# Patient Record
Sex: Male | Born: 1980 | Marital: Single | Smoking: Never smoker
Health system: Southern US, Community
[De-identification: ages and names within clinical notes are randomized; demographics above are authoritative.]

## PROBLEM LIST (undated history)

## (undated) DIAGNOSIS — E103299 Type 1 diabetes mellitus with mild nonproliferative diabetic retinopathy without macular edema, unspecified eye: Secondary | ICD-10-CM

## (undated) DIAGNOSIS — I10 Essential (primary) hypertension: Secondary | ICD-10-CM

## (undated) DIAGNOSIS — E109 Type 1 diabetes mellitus without complications: Secondary | ICD-10-CM

## (undated) DIAGNOSIS — E785 Hyperlipidemia, unspecified: Secondary | ICD-10-CM

## (undated) DIAGNOSIS — N183 Chronic kidney disease, stage 3 (moderate): Secondary | ICD-10-CM

## (undated) HISTORY — DX: Hyperlipidemia, unspecified: E78.5

## (undated) HISTORY — DX: Type 1 diabetes mellitus without complications: E10.9

---
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
## 2014-06-14 ENCOUNTER — Encounter: Payer: Self-pay | Admitting: Family Medicine

## 2014-06-14 ENCOUNTER — Ambulatory Visit (INDEPENDENT_AMBULATORY_CARE_PROVIDER_SITE_OTHER): Payer: Managed Care, Other (non HMO) | Admitting: Family Medicine

## 2014-06-14 VITALS — BP 156/100 | HR 86 | Temp 98.8°F | Resp 18 | Ht 64.5 in | Wt 151.8 lb

## 2014-06-14 DIAGNOSIS — E109 Type 1 diabetes mellitus without complications: Secondary | ICD-10-CM | POA: Diagnosis not present

## 2014-06-14 DIAGNOSIS — I10 Essential (primary) hypertension: Secondary | ICD-10-CM | POA: Diagnosis not present

## 2014-06-14 DIAGNOSIS — E785 Hyperlipidemia, unspecified: Secondary | ICD-10-CM

## 2014-06-14 DIAGNOSIS — Z23 Encounter for immunization: Secondary | ICD-10-CM | POA: Diagnosis not present

## 2014-06-14 LAB — POCT URINALYSIS DIPSTICK
BILIRUBIN UA: NEGATIVE
GLUCOSE UA: 500
KETONES UA: NEGATIVE
Leukocytes, UA: NEGATIVE
Nitrite, UA: NEGATIVE
Protein, UA: 100
SPEC GRAV UA: 1.015
Urobilinogen, UA: 0.2
pH, UA: 6.5

## 2014-06-14 LAB — CBC WITH DIFFERENTIAL/PLATELET
BASOS PCT: 1 % (ref 0–1)
Basophils Absolute: 0.1 10*3/uL (ref 0.0–0.1)
EOS ABS: 0.2 10*3/uL (ref 0.0–0.7)
Eosinophils Relative: 3 % (ref 0–5)
HEMATOCRIT: 41.2 % (ref 39.0–52.0)
Hemoglobin: 13.7 g/dL (ref 13.0–17.0)
Lymphocytes Relative: 28 % (ref 12–46)
Lymphs Abs: 2.2 10*3/uL (ref 0.7–4.0)
MCH: 28.5 pg (ref 26.0–34.0)
MCHC: 33.3 g/dL (ref 30.0–36.0)
MCV: 85.7 fL (ref 78.0–100.0)
MONOS PCT: 6 % (ref 3–12)
MPV: 9.9 fL (ref 8.6–12.4)
Monocytes Absolute: 0.5 10*3/uL (ref 0.1–1.0)
Neutro Abs: 4.8 10*3/uL (ref 1.7–7.7)
Neutrophils Relative %: 62 % (ref 43–77)
PLATELETS: 335 10*3/uL (ref 150–400)
RBC: 4.81 MIL/uL (ref 4.22–5.81)
RDW: 13 % (ref 11.5–15.5)
WBC: 7.7 10*3/uL (ref 4.0–10.5)

## 2014-06-14 LAB — COMPREHENSIVE METABOLIC PANEL
ALBUMIN: 4.2 g/dL (ref 3.5–5.2)
ALK PHOS: 56 U/L (ref 39–117)
ALT: 39 U/L (ref 0–53)
AST: 29 U/L (ref 0–37)
BILIRUBIN TOTAL: 0.4 mg/dL (ref 0.2–1.2)
BUN: 27 mg/dL — ABNORMAL HIGH (ref 6–23)
CO2: 29 mEq/L (ref 19–32)
Calcium: 9.5 mg/dL (ref 8.4–10.5)
Chloride: 100 mEq/L (ref 96–112)
Creat: 1.77 mg/dL — ABNORMAL HIGH (ref 0.50–1.35)
Glucose, Bld: 197 mg/dL — ABNORMAL HIGH (ref 70–99)
Potassium: 4.4 mEq/L (ref 3.5–5.3)
Sodium: 137 mEq/L (ref 135–145)
Total Protein: 7.2 g/dL (ref 6.0–8.3)

## 2014-06-14 LAB — LIPID PANEL
CHOL/HDL RATIO: 2.7 ratio
CHOLESTEROL: 161 mg/dL (ref 0–200)
HDL: 60 mg/dL (ref >=40)
LDL Cholesterol: 91 mg/dL (ref 0–99)
Triglycerides: 48 mg/dL (ref <150)
VLDL: 10 mg/dL (ref 0–40)

## 2014-06-14 LAB — TSH: TSH: 0.951 u[IU]/mL (ref 0.350–4.500)

## 2014-06-14 LAB — MICROALBUMIN, URINE: Microalb, Ur: 47.2 mg/dL — ABNORMAL HIGH (ref <2.0)

## 2014-06-14 LAB — POCT GLYCOSYLATED HEMOGLOBIN (HGB A1C): Hemoglobin A1C: 10

## 2014-06-14 MED ORDER — LISINOPRIL 10 MG PO TABS: 10.0000 mg | ORAL_TABLET | Freq: Every day | ORAL | Status: DC

## 2014-06-14 MED ORDER — SIMVASTATIN 5 MG PO TABS: 5.0000 mg | ORAL_TABLET | Freq: Every day | ORAL | Status: DC

## 2014-06-14 MED ORDER — INSULIN LISPRO 100 UNIT/ML CARTRIDGE: 5.0000 [IU] | Freq: Three times a day (TID) | SUBCUTANEOUS | Status: DC

## 2014-06-14 MED ORDER — HYDROCHLOROTHIAZIDE 12.5 MG PO TABS: 12.5000 mg | ORAL_TABLET | Freq: Every day | ORAL | Status: DC

## 2014-06-14 MED ORDER — AMLODIPINE BESYLATE 5 MG PO TABS: 5.0000 mg | ORAL_TABLET | Freq: Every day | ORAL | Status: DC

## 2014-06-14 NOTE — Progress Notes (Signed)
Subjective:    Patient ID: Jeffrey Hale, male    DOB: [birth year], 34 y.o.   MRN: [id number]  06/14/2014  Establish Care; Medication Refill; Hyperlipidemia; Hypertension; and Diabetes   HPI This 34 y.o. male presents to establish care.    Last physical: 06/01/2013 Colonoscopy:  never TDAP:  More than ten years ago. Pneumovax:  never Hepatitis B: UTD Influenza:   2015 Eye exam:  More than two years ago; due. Dental exam:  Last year; 2015   Gas [place] moving around with company[place] to [place]. Last office visit 06/2013 [place].    HTN: last blood pressure medication one week ago;.  BP usually runs 130/90s.  Diagnosed with HTN eight years ago.  Denies CP/palp/SOB/leg swelling.  Hyperlipidemia:  Started on medication 2013.  Last dose of Simvastatin one week ago.  Denies HA/dizziness/focal weakness/paresthesias.  DMI: diagnosed age 89; one admission in past for hypoglycemia.  Last HgbA1c of unsure.  Lantus 20 units qhs; Humalog 5 units with each meal; just ran out of Humalog yesterday; still has Lantus.  Checks sugars with each meal.  Ran out of strips.    Review of Systems  Constitutional: Negative for fever, chills, diaphoresis, activity change, appetite change and fatigue.  Eyes: Negative for visual disturbance.  Respiratory: Negative for cough and shortness of breath.   Cardiovascular: Negative for chest pain, palpitations and leg swelling.  Endocrine: Negative for cold intolerance, heat intolerance, polydipsia, polyphagia and polyuria.  Neurological: Negative for dizziness, tremors, seizures, syncope, facial asymmetry, speech difficulty, weakness, light-headedness, numbness and headaches.    Past Medical History  Diagnosis Date  . Diabetes mellitus without complication   . Diabetes mellitus type I     onset age 89  . Hypertension   . Hyperlipidemia    Past Surgical History  Procedure Laterality Date  . Lung surgery  1996    Right Lung      No Known Allergies Current Outpatient Prescriptions  Medication Sig Dispense Refill  . [DISCONTINUED] insulin glargine (LANTUS) 100 UNIT/ML injection Inject 20 Units into the skin daily.    Marland Kitchen. amLODipine (NORVASC) 5 MG tablet Take 1 tablet (5 mg total) by mouth daily. 90 tablet 1  . hydrochlorothiazide (HYDRODIURIL) 12.5 MG tablet Take 1 tablet (12.5 mg total) by mouth daily. 90 tablet 1  . insulin lispro (HUMALOG) 100 UNIT/ML cartridge Inject 0.05 mLs (5 Units total) into the skin 3 (three) times daily with meals. 15 mL 11  . lisinopril (PRINIVIL,ZESTRIL) 10 MG tablet Take 1 tablet (10 mg total) by mouth daily. 90 tablet 1  . simvastatin (ZOCOR) 5 MG tablet Take 1 tablet (5 mg total) by mouth daily. 90 tablet 1   No current facility-administered medications for this visit.   History   Social History  . Marital Status: Single    Spouse Name: N/A  . Number of Children: N/A  . Years of Education: N/A   Occupational History  . Not on file.   Social History Main Topics  . Smoking status: Never Smoker   . Smokeless tobacco: Not on file  . Alcohol Use: 0.0 oz/week    0 Standard drinks or equivalent per week     Comment: Beer - ocassionally   . Drug Use: No  . Sexual Activity: Yes   Other Topics Concern  . Not on file   Social History Narrative   Marital status: single; dating seriously x 5 years; [place]; moved to [place] in  08/2013.      Children: 2 children (44 yo daughter, 1 yo son)      Lives: alone; children with mothers in [place]      Employment:  Works for [employer]; Furniture conservator/restorer.      Tobacco: none      Alcohol: 4 drinks per week; no DUIs.      Drugs: none      Exercise:  Sporadic   Family History  Problem Relation Age of Onset  . Diabetes Mother     DMII  . Hypertension Mother   . Diabetes Father     DMII  . Hypertension Father   . Hyperlipidemia Father   . Depression Father         Objective:    BP 156/100 mmHg  Pulse 86   Temp(Src) 98.8 F (37.1 C) (Oral)  Resp 18  Ht 5' 4.5" (1.638 m)  Wt 151 lb 12.8 oz (68.856 kg)  BMI 25.66 kg/m2  SpO2 99% Physical Exam  Constitutional: He is oriented to person, place, and time. He appears well-developed and well-nourished. No distress.  HENT:  Head: Normocephalic and atraumatic.  Right Ear: External ear normal.  Left Ear: External ear normal.  Nose: Nose normal.  Mouth/Throat: Oropharynx is clear and moist.  Eyes: Conjunctivae and EOM are normal. Pupils are equal, round, and reactive to light.  Neck: Normal range of motion. Neck supple. Carotid bruit is not present. No thyromegaly present.  Cardiovascular: Normal rate, regular rhythm, normal heart sounds and intact distal pulses.  Exam reveals no gallop and no friction rub.   No murmur heard. Pulmonary/Chest: Effort normal and breath sounds normal. He has no wheezes. He has no rales.  Abdominal: Soft. Bowel sounds are normal. He exhibits no distension and no mass. There is no tenderness. There is no rebound and no guarding.  Lymphadenopathy:    He has no cervical adenopathy.  Neurological: He is alert and oriented to person, place, and time. No cranial nerve deficit.  Skin: Skin is warm and dry. No rash noted. He is not diaphoretic.  Psychiatric: He has a normal mood and affect. His behavior is normal.  Nursing note and vitals reviewed.  Results for orders placed or performed in visit on 06/14/14  POCT urinalysis dipstick  Result Value Ref Range   Color, UA yellow    Clarity, UA clear    Glucose, UA 500    Bilirubin, UA neg    Ketones, UA neg    Spec Grav, UA 1.015    Blood, UA trace    pH, UA 6.5    Protein, UA 100    Urobilinogen, UA 0.2    Nitrite, UA neg    Leukocytes, UA Negative   POCT glycosylated hemoglobin (Hb A1C)  Result Value Ref Range   Hemoglobin A1C 10.0    TDAP AND PNEUMOVAX ADMINISTERED.      Assessment & Plan:   1. Type 1 diabetes mellitus without complication   2. Essential  hypertension, benign   3. Hyperlipidemia   4. Need for prophylactic vaccination against Streptococcus pneumoniae (pneumococcus)   5. Need for Tdap vaccination     1. DMI: uncontrolled; refill of Humalog provided; refer to endocrinology for diabetes management; refer to ophthalmology for annual diabetic exam. 2.  HTN: uncontrolled due to non-compliance with medications; obtain u/a, labs.  Refill of medications provided. Asymptomatic.  RTC two months for follow-up. 3. Hyperlipidemia: uncontrolled due to non-compliance with medication.  4.  S/p TDAP  5.  S/p Pneumovax due to diabetes.   Meds ordered this encounter  Medications  . DISCONTD: amLODipine (NORVASC) 5 MG tablet    Sig: Take 5 mg by mouth daily.  Marland Kitchen DISCONTD: lisinopril (PRINIVIL,ZESTRIL) 10 MG tablet    Sig: Take 10 mg by mouth daily.  Marland Kitchen DISCONTD: hydrochlorothiazide (MICROZIDE) 12.5 MG capsule    Sig: Take 12.5 mg by mouth daily.  Marland Kitchen DISCONTD: simvastatin (ZOCOR) 5 MG tablet    Sig: Take 5 mg by mouth daily.  Marland Kitchen DISCONTD: insulin glargine (LANTUS) 100 UNIT/ML injection    Sig: Inject 20 Units into the skin daily.  Marland Kitchen DISCONTD: insulin lispro (HUMALOG) 100 UNIT/ML injection    Sig: Inject 5 Units into the skin 3 (three) times daily with meals.  Marland Kitchen amLODipine (NORVASC) 5 MG tablet    Sig: Take 1 tablet (5 mg total) by mouth daily.    Dispense:  90 tablet    Refill:  1  . hydrochlorothiazide (HYDRODIURIL) 12.5 MG tablet    Sig: Take 1 tablet (12.5 mg total) by mouth daily.    Dispense:  90 tablet    Refill:  1  . lisinopril (PRINIVIL,ZESTRIL) 10 MG tablet    Sig: Take 1 tablet (10 mg total) by mouth daily.    Dispense:  90 tablet    Refill:  1  . insulin lispro (HUMALOG) 100 UNIT/ML cartridge    Sig: Inject 0.05 mLs (5 Units total) into the skin 3 (three) times daily with meals.    Dispense:  15 mL    Refill:  11  . simvastatin (ZOCOR) 5 MG tablet    Sig: Take 1 tablet (5 mg total) by mouth daily.    Dispense:  90 tablet      Refill:  1    Return in about 2 months (around 08/14/2014) for recheck blood pressure.     Jeffrey Hale Paulita Fujita, M.D. [facility] [address] [place], [place]  [address] [phone] phone [phone] fax

## 2014-06-14 NOTE — Patient Instructions (Signed)

## 2014-06-30 ENCOUNTER — Encounter: Payer: Self-pay | Admitting: Endocrinology

## 2014-06-30 ENCOUNTER — Ambulatory Visit (INDEPENDENT_AMBULATORY_CARE_PROVIDER_SITE_OTHER): Payer: Managed Care, Other (non HMO) | Admitting: Endocrinology

## 2014-06-30 VITALS — BP 142/90 | HR 98 | Temp 98.3°F | Ht 64.5 in | Wt 155.0 lb

## 2014-06-30 DIAGNOSIS — E109 Type 1 diabetes mellitus without complications: Secondary | ICD-10-CM

## 2014-06-30 MED ORDER — GLUCAGON (RDNA) 1 MG IJ KIT: 1.0000 mg | PACK | Freq: Once | INTRAMUSCULAR | Status: AC | PRN

## 2014-06-30 NOTE — Patient Instructions (Addendum)
good diet and exercise significantly improve the control of your diabetes.  please let me know if you wish to be referred to a dietician.  high blood sugar is very risky to your health.  you should see an eye doctor and dentist every year.  It is very important to get all recommended vaccinations.  controlling your blood pressure and cholesterol drastically reduces the damage diabetes does to your body.  Those who smoke should quit.  please discuss these with your doctor.  check your blood sugar 4 times a day.  vary the time of day when you check, between before the 3 meals, and at bedtime.  also check if you have symptoms of your blood sugar being too high or too low.  please keep a record of the readings and bring it to your next appointment here.  You can write it on any piece of paper.  please call us sooner if your blood sugar goes below 70, or if you have a lot of readings over 200.  Please see Jeffrey Hale to day. To learn about pump therapy. For now: Please reduce the lantus to 16 units at bedtime, and Increase the humalog to 3-6 units 3 times a day (just before each meal). Please come back for a follow-up appointment in 2-4 weeks. i have sent a prescription to your pharmacy, for a medication called "glucagon."  This is a single-use emergency kit.  It will help you when your blood sugar is so low, you need someone else to help you.  The side-effect is nausea, so you should be turned on your side after getting this shot.

## 2014-06-30 NOTE — Progress Notes (Signed)
Subjective:    Patient ID: Jeffrey Hale, male    DOB: [birth year], 34 y.o.   MRN: [id number]  HPI pt states DM was dx'ed in 1990; he has mild if any neuropathy of the lower extremities; he is unaware of any associated chronic complications; he has been on insulin since dx; pt says his diet and exercise are good; he has never had pancreatitis or DKA.  He has only had 1 episode of severe hypoglycemia (2013).   He takes 3-5 units of humalog 3 times a day (just before each meal).  he brings a record of his cbg's which i have reviewed today.  It varies from 37-300.  It is lowest many hrs after a meal, and higher after eating.   Past Medical History  Diagnosis Date  . Diabetes mellitus without complication   . Diabetes mellitus type I     onset age 3  . Hypertension   . Hyperlipidemia     Past Surgical History  Procedure Laterality Date  . Lung surgery  1996    Right Lung     History   Social History  . Marital Status: Single    Spouse Name: N/A  . Number of Children: N/A  . Years of Education: N/A   Occupational History  . Not on file.   Social History Main Topics  . Smoking status: Never Smoker   . Smokeless tobacco: Not on file  . Alcohol Use: 0.0 oz/week    0 Standard drinks or equivalent per week     Comment: Beer - ocassionally   . Drug Use: No  . Sexual Activity: Yes   Other Topics Concern  . Not on file   Social History Narrative   Marital status: single; dating seriously x 5 years; [place] originally; moved to [place] in 08/2013.      Children: 2 children (1 yo daughter, 56 yo son)      Lives: alone; children with mothers in [place].      Employment:  Works for [employer]; Educational psychologist.      Tobacco: none      Alcohol: 4 drinks per week; no DUIs.      Drugs: none      Exercise:  Sporadic    Current Outpatient Prescriptions on File Prior to Visit  Medication Sig Dispense Refill  . amLODipine (NORVASC) 5 MG tablet Take 1 tablet (5 mg total) by mouth  daily. 90 tablet 1  . hydrochlorothiazide (HYDRODIURIL) 12.5 MG tablet Take 1 tablet (12.5 mg total) by mouth daily. 90 tablet 1  . insulin lispro (HUMALOG) 100 UNIT/ML cartridge Inject 0.05 mLs (5 Units total) into the skin 3 (three) times daily with meals. 15 mL 11  . lisinopril (PRINIVIL,ZESTRIL) 10 MG tablet Take 1 tablet (10 mg total) by mouth daily. 90 tablet 1  . simvastatin (ZOCOR) 5 MG tablet Take 1 tablet (5 mg total) by mouth daily. 90 tablet 1   No current facility-administered medications on file prior to visit.    No Known Allergies  Family History  Problem Relation Age of Onset  . Diabetes Mother     DMII  . Hypertension Mother   . Diabetes Father     DMII  . Hypertension Father   . Hyperlipidemia Father   . Depression Father     BP 142/90 mmHg  Pulse 98  Temp(Src) 98.3 F (36.8 C) (Oral)  Ht 5' 4.5" (1.638 m)  Wt 155 lb (70.308 kg)  BMI  26.20 kg/m2  SpO2 99%   Review of Systems denies weight loss, blurry vision, headache, chest pain, sob, n/v, urinary frequency, muscle cramps, excessive diaphoresis, depression, cold intolerance, rhinorrhea, and easy bruising.        Objective:   Physical Exam VS: see vs page GEN: no distress HEAD: head: no deformity eyes: no periorbital swelling, no proptosis external nose and ears are normal mouth: no lesion seen NECK: supple, thyroid is not enlarged CHEST WALL: no deformity LUNGS: clear to auscultation BREASTS:  No gynecomastia CV: reg rate and rhythm, no murmur ABD: abdomen is soft, nontender.  no hepatosplenomegaly.  not distended.  no hernia MUSCULOSKELETAL: muscle bulk and strength are grossly normal.  no obvious joint swelling.  gait is normal and steady EXTEMITIES: no deformity.  no ulcer on the feet.  feet are of normal color and temp.  no edema PULSES: dorsalis pedis intact bilat.  no carotid bruit NEURO:  cn 2-12 grossly intact.   readily moves all 4's.  sensation is intact to touch on the feet SKIN:   Normal texture and temperature.  No rash or suspicious lesion is visible.   NODES:  None palpable at the neck.   PSYCH: alert, well-oriented.  Does not appear anxious nor depressed.  Lab Results  Component Value Date   HGBA1C 10.0 06/14/2014   i have discussed with Leonia Reader, RN today.  Pt will pursue pump.    Assessment & Plan:  DM: severe exacerbation HTN: new to me.  We'll recheck next time.  same medications.   Patient is advised the following: Patient Instructions  good diet and exercise significantly improve the control of your diabetes.  please let me know if you wish to be referred to a dietician.  high blood sugar is very risky to your health.  you should see an eye doctor and dentist every year.  It is very important to get all recommended vaccinations.  controlling your blood pressure and cholesterol drastically reduces the damage diabetes does to your body.  Those who smoke should quit.  please discuss these with your doctor.  check your blood sugar 4 times a day.  vary the time of day when you check, between before the 3 meals, and at bedtime.  also check if you have symptoms of your blood sugar being too high or too low.  please keep a record of the readings and bring it to your next appointment here.  You can write it on any piece of paper.  please call us sooner if your blood sugar goes below 70, or if you have a lot of readings over 200.  Please see Vaughan Basta to day. To learn about pump therapy. For now: Please reduce the lantus to 16 units at bedtime, and Increase the humalog to 3-6 units 3 times a day (just before each meal). Please come back for a follow-up appointment in 2-4 weeks. i have sent a prescription to your pharmacy, for a medication called "glucagon."  This is a single-use emergency kit.  It will help you when your blood sugar is so low, you need someone else to help you.  The side-effect is nausea, so you should be turned on your side after getting this shot.

## 2014-07-01 ENCOUNTER — Telehealth: Payer: Self-pay | Admitting: Endocrinology

## 2014-07-01 DIAGNOSIS — E119 Type 2 diabetes mellitus without complications: Secondary | ICD-10-CM | POA: Insufficient documentation

## 2014-07-01 NOTE — Telephone Encounter (Signed)
please call patient: Please come in sometime for labs.  No need to be fasting

## 2014-07-02 NOTE — Telephone Encounter (Signed)
I contacted the patient. Patient scheduled for 07/06/2014 to have labs done.

## 2014-07-06 ENCOUNTER — Other Ambulatory Visit: Payer: Managed Care, Other (non HMO)

## 2014-07-08 DIAGNOSIS — E103299 Type 1 diabetes mellitus with mild nonproliferative diabetic retinopathy without macular edema, unspecified eye: Secondary | ICD-10-CM

## 2014-07-08 HISTORY — DX: Type 1 diabetes mellitus with mild nonproliferative diabetic retinopathy without macular edema, unspecified eye: E10.3299

## 2014-07-08 LAB — HM DIABETES EYE EXAM

## 2014-07-28 ENCOUNTER — Ambulatory Visit: Payer: Managed Care, Other (non HMO) | Admitting: Endocrinology

## 2014-08-03 ENCOUNTER — Ambulatory Visit (INDEPENDENT_AMBULATORY_CARE_PROVIDER_SITE_OTHER): Payer: Managed Care, Other (non HMO) | Admitting: Endocrinology

## 2014-08-03 ENCOUNTER — Encounter: Payer: Self-pay | Admitting: Endocrinology

## 2014-08-03 VITALS — BP 126/90 | HR 82 | Temp 98.5°F | Ht 64.5 in | Wt 154.0 lb

## 2014-08-03 DIAGNOSIS — E109 Type 1 diabetes mellitus without complications: Secondary | ICD-10-CM | POA: Diagnosis not present

## 2014-08-03 MED ORDER — INSULIN LISPRO 100 UNIT/ML CARTRIDGE: 7.0000 [IU] | Freq: Three times a day (TID) | SUBCUTANEOUS | Status: DC

## 2014-08-03 MED ORDER — INSULIN ASPART 100 UNIT/ML FLEXPEN: 7.0000 [IU] | PEN_INJECTOR | Freq: Three times a day (TID) | SUBCUTANEOUS | Status: DC

## 2014-08-03 NOTE — Progress Notes (Signed)
Subjective:    Patient ID: Jeffrey Hale, male    DOB: Dec 25, 1980, 34 y.o.   MRN: [id number]  HPI Pt returns for f/u of diabetes mellitus: DM type: 1 Dx'ed: 1990 Complications: none Therapy: insulin since dx DKA: never Severe hypoglycemia: once (2013) Pancreatitis: never Other: he takes multiple daily injections.   Interval history: pt says he takes insulin as rx'ed.  no cbg record, but states cbg's vary from 98-180.  It is in general higher as the day goes on.  He is pursuing an insulin pump.  He works M-F, but says cbg's are about the same during the week, as on weekend.  Past Medical History  Diagnosis Date  . Diabetes mellitus without complication   . Diabetes mellitus type I     onset age 78  . Hypertension   . Hyperlipidemia     Past Surgical History  Procedure Laterality Date  . Lung surgery  1996    Right Lung     History   Social History  . Marital Status: Single    Spouse Name: N/A  . Number of Children: N/A  . Years of Education: N/A   Occupational History  . Not on file.   Social History Main Topics  . Smoking status: Never Smoker   . Smokeless tobacco: Not on file  . Alcohol Use: 0.0 oz/week    0 Standard drinks or equivalent per week     Comment: Beer - ocassionally   . Drug Use: No  . Sexual Activity: Yes   Other Topics Concern  . Not on file   Social History Narrative   Marital status: single; dating seriously x 5 years; [place]; moved to [place] in 08/2013.      Children: 2 children 68(8 yo daughter, 1 yo son)      Lives: alone; children with mothers in [place].      Employment:  Works for [employer]; Furniture conservator/restorerproduction manager.      Tobacco: none      Alcohol: 4 drinks per week; no DUIs.      Drugs: none      Exercise:  Sporadic    Current Outpatient Prescriptions on File Prior to Visit  Medication Sig Dispense Refill  . amLODipine (NORVASC) 5 MG tablet Take 1 tablet (5 mg total) by mouth daily. 90 tablet 1  . glucagon (GLUCAGON  EMERGENCY) 1 MG injection Inject 1 mg into the muscle once as needed. 1 each 12  . hydrochlorothiazide (HYDRODIURIL) 12.5 MG tablet Take 1 tablet (12.5 mg total) by mouth daily. 90 tablet 1  . insulin glargine (LANTUS) 100 UNIT/ML injection Inject 15 Units into the skin at bedtime.     Marland Kitchen. lisinopril (PRINIVIL,ZESTRIL) 10 MG tablet Take 1 tablet (10 mg total) by mouth daily. 90 tablet 1  . simvastatin (ZOCOR) 5 MG tablet Take 1 tablet (5 mg total) by mouth daily. 90 tablet 1   No current facility-administered medications on file prior to visit.    No Known Allergies  Family History  Problem Relation Age of Onset  . Diabetes Mother     DMII  . Hypertension Mother   . Diabetes Father     DMII  . Hypertension Father   . Hyperlipidemia Father   . Depression Father     BP 126/90 mmHg  Pulse 82  Temp(Src) 98.5 F (36.9 C) (Oral)  Ht 5' 4.5" (1.638 m)  Wt 154 lb (69.854 kg)  BMI 26.04 kg/m2  SpO2 98%  Review of Systems He denies hypoglycemia.    Objective:   Physical Exam VITAL SIGNS:  See vs page. GENERAL: no distress. SKIN:  Insulin injection sites at the triceps areas are normal.      Assessment & Plan:  DM: The pattern of his cbg's indicates he needs some adjustment in his therapy.   Patient is advised the following: Patient Instructions  check your blood sugar 4 times a day.  vary the time of day when you check, between before the 3 meals, and at bedtime.  also check if you have symptoms of your blood sugar being too high or too low.  please keep a record of the readings and bring it to your next appointment here.  You can write it on any piece of paper.  please call us sooner if your blood sugar goes below 70, or if you have a lot of readings over 200.  Please reduce the lantus to 15 units at bedtime, and Increase the humalog to 4-7 units 3 times a day (just before each meal). Please come back for a follow-up appointment in 6 weeks.

## 2014-08-03 NOTE — Patient Instructions (Addendum)
check your blood sugar 4 times a day.  vary the time of day when you check, between before the 3 meals, and at bedtime.  also check if you have symptoms of your blood sugar being too high or too low.  please keep a record of the readings and bring it to your next appointment here.  You can write it on any piece of paper.  please call us sooner if your blood sugar goes below 70, or if you have a lot of readings over 200.  Please reduce the lantus to 15 units at bedtime, and Increase the humalog to 4-7 units 3 times a day (just before each meal). Please come back for a follow-up appointment in 6 weeks.

## 2014-08-16 ENCOUNTER — Ambulatory Visit (INDEPENDENT_AMBULATORY_CARE_PROVIDER_SITE_OTHER): Payer: Managed Care, Other (non HMO) | Admitting: Family Medicine

## 2014-08-16 ENCOUNTER — Encounter: Payer: Self-pay | Admitting: Family Medicine

## 2014-08-16 VITALS — BP 123/71 | HR 99 | Temp 99.0°F | Resp 16 | Ht 64.75 in | Wt 152.6 lb

## 2014-08-16 DIAGNOSIS — E1021 Type 1 diabetes mellitus with diabetic nephropathy: Secondary | ICD-10-CM

## 2014-08-16 DIAGNOSIS — N183 Chronic kidney disease, stage 3 unspecified: Secondary | ICD-10-CM

## 2014-08-16 DIAGNOSIS — E785 Hyperlipidemia, unspecified: Secondary | ICD-10-CM | POA: Diagnosis not present

## 2014-08-16 DIAGNOSIS — I10 Essential (primary) hypertension: Secondary | ICD-10-CM | POA: Insufficient documentation

## 2014-08-16 DIAGNOSIS — N289 Disorder of kidney and ureter, unspecified: Secondary | ICD-10-CM

## 2014-08-16 HISTORY — DX: Essential (primary) hypertension: I10

## 2014-08-16 HISTORY — DX: Chronic kidney disease, stage 3 unspecified: N18.30

## 2014-08-16 LAB — COMPREHENSIVE METABOLIC PANEL
ALBUMIN: 4.5 g/dL (ref 3.5–5.2)
ALT: 29 U/L (ref 0–53)
AST: 21 U/L (ref 0–37)
Alkaline Phosphatase: 48 U/L (ref 39–117)
BUN: 32 mg/dL — ABNORMAL HIGH (ref 6–23)
CO2: 27 meq/L (ref 19–32)
Calcium: 9.8 mg/dL (ref 8.4–10.5)
Chloride: 103 mEq/L (ref 96–112)
Creat: 1.97 mg/dL — ABNORMAL HIGH (ref 0.50–1.35)
GLUCOSE: 84 mg/dL (ref 70–99)
POTASSIUM: 4.5 meq/L (ref 3.5–5.3)
SODIUM: 138 meq/L (ref 135–145)
Total Bilirubin: 0.5 mg/dL (ref 0.2–1.2)
Total Protein: 7.5 g/dL (ref 6.0–8.3)

## 2014-08-16 NOTE — Patient Instructions (Signed)

## 2014-08-16 NOTE — Progress Notes (Signed)
Subjective:    Patient ID: Jeffrey Hale, male    DOB: [birth year], 34 y.o.   MRN: [id number]  08/16/2014  Hypertension   HPI This 34 y.o. male presents for evaluation of the following:  1. HTN: management changes made at last visit included restarting Amlodipine, HCTZ, and Lisinopril.  Not checking BP at home.  Patient reports good compliance with medication, good tolerance to medication, and good symptom control.  Denies CP/palp/SOB/leg swelling.    2. Renal insufficiency:  Creatinine of 1.71 at last visit two months ago; +microalbumin; has been diagnosed with renal insufficiency in the past with improvement eventually.    3. Hyperlipidemia:  Patient reports good compliance with medication, good tolerance to medication, and good symptom control.   Lab Results  Component Value Date   CHOL 161 06/14/2014   HDL 60 06/14/2014   LDLCALC 91 06/14/2014   TRIG 48 06/14/2014   CHOLHDL 2.7 06/14/2014    4. DMI: s/p evaluation by Dr. Everardo All; 80-140; decreased Lantus to 15 units per recommendation of Everardo All but had to increase dose back to 20units due to elevated sugars. History of hepatitis B series in college.   Tolerated TDAP and Pneumovax at last visit.   Denies numbness/tingling/burning in feet.     Review of Systems  Constitutional: Negative for fever, chills, diaphoresis, activity change, appetite change and fatigue.  Eyes: Negative for visual disturbance.  Respiratory: Negative for cough and shortness of breath.   Cardiovascular: Negative for chest pain, palpitations and leg swelling.  Endocrine: Negative for cold intolerance, heat intolerance, polydipsia, polyphagia and polyuria.  Neurological: Negative for dizziness, tremors, seizures, syncope, facial asymmetry, speech difficulty, weakness, light-headedness, numbness and headaches.    Past Medical History  Diagnosis Date  . Diabetes mellitus without complication   . Diabetes mellitus type I     onset age 19  .  Hypertension   . Hyperlipidemia    Past Surgical History  Procedure Laterality Date  . Lung surgery  1996    Right Lung    No Known Allergies Current Outpatient Prescriptions  Medication Sig Dispense Refill  . amLODipine (NORVASC) 5 MG tablet Take 1 tablet (5 mg total) by mouth daily. 90 tablet 1  . glucagon (GLUCAGON EMERGENCY) 1 MG injection Inject 1 mg into the muscle once as needed. 1 each 12  . hydrochlorothiazide (HYDRODIURIL) 12.5 MG tablet Take 1 tablet (12.5 mg total) by mouth daily. 90 tablet 1  . insulin aspart (NOVOLOG FLEXPEN) 100 UNIT/ML FlexPen Inject 7 Units into the skin 3 (three) times daily with meals. And pen needles 4/day 15 mL 11  . insulin glargine (LANTUS) 100 UNIT/ML injection Inject 15 Units into the skin at bedtime.     Marland Kitchen lisinopril (PRINIVIL,ZESTRIL) 10 MG tablet Take 1 tablet (10 mg total) by mouth daily. 90 tablet 1  . simvastatin (ZOCOR) 5 MG tablet Take 1 tablet (5 mg total) by mouth daily. 90 tablet 1   No current facility-administered medications for this visit.   History   Social History  . Marital Status: Single    Spouse Name: N/A  . Number of Children: N/A  . Years of Education: N/A   Occupational History  . Not on file.   Social History Main Topics  . Smoking status: Never Smoker   . Smokeless tobacco: Not on file  . Alcohol Use: 0.0 oz/week    0 Standard drinks or equivalent per week     Comment: Beer - ocassionally   . Drug  Use: No  . Sexual Activity: Yes   Other Topics Concern  . Not on file   Social History Narrative   Marital status: single; dating seriously x 5 years; [place]; moved to [place] in 08/2013.      Children: 2 children (30 yo daughter, 1 yo son)      Lives: alone; children with mothers in [place]      Employment:  Works for [employer]; Furniture conservator/restorer.      Tobacco: none      Alcohol: 4 drinks per week; no DUIs.      Drugs: none      Exercise:  Sporadic   Family History  Problem Relation Age  of Onset  . Diabetes Mother     DMII  . Hypertension Mother   . Diabetes Father     DMII  . Hypertension Father   . Hyperlipidemia Father   . Depression Father        Objective:    BP 123/71 mmHg  Pulse 99  Temp(Src) 99 F (37.2 C) (Oral)  Resp 16  Ht 5' 4.75" (1.645 m)  Wt 152 lb 9.6 oz (69.219 kg)  BMI 25.58 kg/m2  SpO2 99% Physical Exam  Constitutional: He is oriented to person, place, and time. He appears well-developed and well-nourished. No distress.  HENT:  Head: Normocephalic and atraumatic.  Right Ear: External ear normal.  Left Ear: External ear normal.  Nose: Nose normal.  Mouth/Throat: Oropharynx is clear and moist.  Eyes: Conjunctivae and EOM are normal. Pupils are equal, round, and reactive to light.  Neck: Normal range of motion. Neck supple. Carotid bruit is not present. No thyromegaly present.  Cardiovascular: Normal rate, regular rhythm, normal heart sounds and intact distal pulses.  Exam reveals no gallop and no friction rub.   No murmur heard. Pulmonary/Chest: Effort normal and breath sounds normal. He has no wheezes. He has no rales.  Abdominal: Soft. Bowel sounds are normal. He exhibits no distension and no mass. There is no tenderness. There is no rebound and no guarding.  Lymphadenopathy:    He has no cervical adenopathy.  Neurological: He is alert and oriented to person, place, and time. No cranial nerve deficit.  Skin: Skin is warm and dry. No rash noted. He is not diaphoretic.  Psychiatric: He has a normal mood and affect. His behavior is normal.  Nursing note and vitals reviewed.  Results for orders placed or performed in visit on 08/16/14  Comprehensive metabolic panel  Result Value Ref Range   Sodium 138 135 - 145 mEq/L   Potassium 4.5 3.5 - 5.3 mEq/L   Chloride 103 96 - 112 mEq/L   CO2 27 19 - 32 mEq/L   Glucose, Bld 84 70 - 99 mg/dL   BUN 32 (H) 6 - 23 mg/dL   Creat 1.61 (H) 0.96 - 1.35 mg/dL   Total Bilirubin 0.5 0.2 - 1.2 mg/dL     Alkaline Phosphatase 48 39 - 117 U/L   AST 21 0 - 37 U/L   ALT 29 0 - 53 U/L   Total Protein 7.5 6.0 - 8.3 g/dL   Albumin 4.5 3.5 - 5.2 g/dL   Calcium 9.8 8.4 - 04.5 mg/dL       Assessment & Plan:   1. Essential hypertension, benign   2. Renal insufficiency   3. Hyperlipidemia   4. Type 1 diabetes mellitus with nephropathy     1. HTN: Controlled; obtain labs; continue current medications.  2.  Renal insufficiency: worsening since last visit; will warrant referral to nephrology for further management. 3.  Hyperlipidemia:  Controlled; continue current medications. 4.  DMI with nephropathy: uncontrolled; s/p consultation with endocrinology for ongoing management.   No orders of the defined types were placed in this encounter.    Return in about 6 months (around 02/15/2015) for complete physical examiniation.    Kristi Paulita Fujita, M.D. [facility] [address] [place], [place]  [address] [phone] phone [phone] fax

## 2014-09-14 ENCOUNTER — Ambulatory Visit: Payer: Managed Care, Other (non HMO) | Admitting: Endocrinology

## 2014-09-15 ENCOUNTER — Encounter: Payer: Self-pay | Admitting: Endocrinology

## 2014-09-15 ENCOUNTER — Ambulatory Visit (INDEPENDENT_AMBULATORY_CARE_PROVIDER_SITE_OTHER): Payer: Managed Care, Other (non HMO) | Admitting: Endocrinology

## 2014-09-15 VITALS — BP 118/70 | HR 94 | Temp 99.1°F | Ht 64.5 in | Wt 160.0 lb

## 2014-09-15 DIAGNOSIS — Z0189 Encounter for other specified special examinations: Secondary | ICD-10-CM

## 2014-09-15 DIAGNOSIS — E1021 Type 1 diabetes mellitus with diabetic nephropathy: Secondary | ICD-10-CM

## 2014-09-15 DIAGNOSIS — Z0000 Encounter for general adult medical examination without abnormal findings: Secondary | ICD-10-CM

## 2014-09-15 LAB — POCT GLYCOSYLATED HEMOGLOBIN (HGB A1C): HEMOGLOBIN A1C: 8.2

## 2014-09-15 MED ORDER — INSULIN ASPART 100 UNIT/ML FLEXPEN: 8.0000 [IU] | PEN_INJECTOR | Freq: Three times a day (TID) | SUBCUTANEOUS | Status: DC

## 2014-09-15 NOTE — Progress Notes (Signed)
Subjective:    Patient ID: Jeffrey Hale, male    DOB: June 15, 1980, 34 y.o.   MRN: [id number]  HPI Pt returns for f/u of diabetes mellitus: DM type: 1 Dx'ed: 1990 Complications: none Therapy: insulin since dx DKA: never Severe hypoglycemia: once (2013) Pancreatitis: never Other: he takes multiple daily injections.   Interval history: pt says he takes insulin as rx'ed.  He is still pursuing an insulin pump. no cbg record, but states cbg's vary from 80-140.  It is in general higher as the day goes on.   Past Medical History  Diagnosis Date  . Diabetes mellitus without complication   . Diabetes mellitus type I     onset age 89  . Hypertension   . Hyperlipidemia     Past Surgical History  Procedure Laterality Date  . Lung surgery  1996    Right Lung     History   Social History  . Marital Status: Single    Spouse Name: N/A  . Number of Children: N/A  . Years of Education: N/A   Occupational History  . Not on file.   Social History Main Topics  . Smoking status: Never Smoker   . Smokeless tobacco: Not on file  . Alcohol Use: 0.0 oz/week    0 Standard drinks or equivalent per week     Comment: Beer - ocassionally   . Drug Use: No  . Sexual Activity: Yes   Other Topics Concern  . Not on file   Social History Narrative   Marital status: single; dating seriously x 5 years; [place]; moved to [place] in 08/2013.      Children: 2 children 109(8 yo daughter, 1 yo son)      Lives: alone; children with mothers in [place].      Employment:  Works for [employer]; Furniture conservator/restorerproduction manager.      Tobacco: none      Alcohol: 4 drinks per week; no DUIs.      Drugs: none      Exercise:  Sporadic    Current Outpatient Prescriptions on File Prior to Visit  Medication Sig Dispense Refill  . amLODipine (NORVASC) 5 MG tablet Take 1 tablet (5 mg total) by mouth daily. 90 tablet 1  . glucagon (GLUCAGON EMERGENCY) 1 MG injection Inject 1 mg into the muscle once as needed. 1  each 12  . hydrochlorothiazide (HYDRODIURIL) 12.5 MG tablet Take 1 tablet (12.5 mg total) by mouth daily. 90 tablet 1  . insulin glargine (LANTUS) 100 UNIT/ML injection Inject 13 Units into the skin at bedtime.     Marland Kitchen. lisinopril (PRINIVIL,ZESTRIL) 10 MG tablet Take 1 tablet (10 mg total) by mouth daily. 90 tablet 1  . simvastatin (ZOCOR) 5 MG tablet Take 1 tablet (5 mg total) by mouth daily. 90 tablet 1   No current facility-administered medications on file prior to visit.    No Known Allergies  Family History  Problem Relation Age of Onset  . Diabetes Mother     DMII  . Hypertension Mother   . Diabetes Father     DMII  . Hypertension Father   . Hyperlipidemia Father   . Depression Father     BP 118/70 mmHg  Pulse 94  Temp(Src) 99.1 F (37.3 C) (Oral)  Ht 5' 4.5" (1.638 m)  Wt 160 lb (72.576 kg)  BMI 27.05 kg/m2  SpO2 97%  Review of Systems He denies hypoglycemia.      Objective:   Physical  Exam VITAL SIGNS:  See vs page GENERAL: no distress Pulses: dorsalis pedis intact bilat.   MSK: no deformity of the feet CV: no leg edema Skin:  no ulcer on the feet.  normal color and temp on the feet. Neuro: sensation is intact to touch on the feet.    i personally reviewed electrocardiogram tracing: short PR.   A1c=8.2%    Assessment & Plan:  DM: glycemic control is improved, but the pattern of his cbg's indicates he needs some adjustment in his therapy   Patient is advised the following: Patient Instructions  check your blood sugar 4 times a day.  vary the time of day when you check, between before the 3 meals, and at bedtime.  also check if you have symptoms of your blood sugar being too high or too low.  please keep a record of the readings and bring it to your next appointment here.  You can write it on any piece of paper.  please call us sooner if your blood sugar goes below 70, or if you have a lot of readings over 200.  Please reduce the lantus to 13 units at  bedtime, and:  Increase the humalog to 5-8 units 3 times a day (just before each meal).  Please come back for a follow-up appointment in 3 months.

## 2014-09-15 NOTE — Patient Instructions (Addendum)
check your blood sugar 4 times a day.  vary the time of day when you check, between before the 3 meals, and at bedtime.  also check if you have symptoms of your blood sugar being too high or too low.  please keep a record of the readings and bring it to your next appointment here.  You can write it on any piece of paper.  please call us sooner if your blood sugar goes below 70, or if you have a lot of readings over 200.  Please reduce the lantus to 13 units at bedtime, and:  Increase the humalog to 5-8 units 3 times a day (just before each meal).  Please come back for a follow-up appointment in 3 months.

## 2014-11-11 ENCOUNTER — Encounter (HOSPITAL_COMMUNITY): Payer: Self-pay | Admitting: *Deleted

## 2014-11-11 DIAGNOSIS — E103299 Type 1 diabetes mellitus with mild nonproliferative diabetic retinopathy without macular edema, unspecified eye: Secondary | ICD-10-CM

## 2014-11-22 ENCOUNTER — Telehealth: Payer: Self-pay

## 2014-11-22 ENCOUNTER — Telehealth: Payer: Self-pay | Admitting: Endocrinology

## 2014-11-22 MED ORDER — INSULIN GLARGINE 100 UNIT/ML SOLOSTAR PEN: PEN_INJECTOR | SUBCUTANEOUS | Status: DC

## 2014-11-22 NOTE — Telephone Encounter (Signed)
Pt states he got all his refill request but his lantis   Please call

## 2014-11-22 NOTE — Telephone Encounter (Signed)
Spoke with pt, advised him to call his endocrinologist to get a refill since he has taken over his care for diabetes. Pt understood.

## 2014-11-22 NOTE — Telephone Encounter (Signed)
Pt is out of lantus call in the rx to [facility] please

## 2014-11-22 NOTE — Telephone Encounter (Signed)
Rx sent per pt's request.  

## 2014-11-22 NOTE — Telephone Encounter (Signed)
He sees an endocrinologist.

## 2014-12-17 ENCOUNTER — Ambulatory Visit: Payer: Managed Care, Other (non HMO) | Admitting: Endocrinology

## 2015-01-05 ENCOUNTER — Ambulatory Visit: Payer: Managed Care, Other (non HMO) | Admitting: Endocrinology

## 2015-01-12 ENCOUNTER — Other Ambulatory Visit: Payer: Self-pay | Admitting: Family Medicine

## 2015-01-27 ENCOUNTER — Ambulatory Visit: Payer: Managed Care, Other (non HMO) | Admitting: Endocrinology

## 2015-02-02 ENCOUNTER — Ambulatory Visit: Payer: Managed Care, Other (non HMO) | Admitting: Endocrinology

## 2015-02-23 ENCOUNTER — Encounter: Payer: Managed Care, Other (non HMO) | Admitting: Family Medicine

## 2015-02-25 ENCOUNTER — Encounter: Payer: Managed Care, Other (non HMO) | Admitting: Family Medicine

## 2015-03-07 ENCOUNTER — Encounter: Payer: Managed Care, Other (non HMO) | Admitting: Family Medicine

## 2015-03-07 ENCOUNTER — Ambulatory Visit: Payer: Managed Care, Other (non HMO) | Admitting: Endocrinology

## 2015-03-14 ENCOUNTER — Ambulatory Visit: Payer: Managed Care, Other (non HMO) | Admitting: Endocrinology

## 2015-03-16 ENCOUNTER — Emergency Department (HOSPITAL_COMMUNITY): Payer: Managed Care, Other (non HMO)

## 2015-03-16 ENCOUNTER — Inpatient Hospital Stay (HOSPITAL_COMMUNITY)
Admission: EM | Admit: 2015-03-16 | Discharge: 2015-03-18 | DRG: 854 | Disposition: A | Discharge status: Home / Self Care | Payer: Managed Care, Other (non HMO) | Attending: Family Medicine | Admitting: Family Medicine

## 2015-03-16 DIAGNOSIS — I10 Essential (primary) hypertension: Secondary | ICD-10-CM | POA: Diagnosis present

## 2015-03-16 DIAGNOSIS — E1022 Type 1 diabetes mellitus with diabetic chronic kidney disease: Secondary | ICD-10-CM | POA: Diagnosis present

## 2015-03-16 DIAGNOSIS — E10319 Type 1 diabetes mellitus with unspecified diabetic retinopathy without macular edema: Secondary | ICD-10-CM | POA: Diagnosis present

## 2015-03-16 DIAGNOSIS — K3580 Unspecified acute appendicitis: Secondary | ICD-10-CM | POA: Diagnosis present

## 2015-03-16 DIAGNOSIS — E860 Dehydration: Secondary | ICD-10-CM | POA: Diagnosis present

## 2015-03-16 DIAGNOSIS — N183 Chronic kidney disease, stage 3 unspecified: Secondary | ICD-10-CM | POA: Diagnosis present

## 2015-03-16 DIAGNOSIS — E1021 Type 1 diabetes mellitus with diabetic nephropathy: Secondary | ICD-10-CM | POA: Diagnosis present

## 2015-03-16 DIAGNOSIS — A09 Infectious gastroenteritis and colitis, unspecified: Secondary | ICD-10-CM | POA: Diagnosis present

## 2015-03-16 DIAGNOSIS — R1084 Generalized abdominal pain: Secondary | ICD-10-CM | POA: Diagnosis not present

## 2015-03-16 DIAGNOSIS — A419 Sepsis, unspecified organism: Secondary | ICD-10-CM | POA: Diagnosis not present

## 2015-03-16 DIAGNOSIS — Z794 Long term (current) use of insulin: Secondary | ICD-10-CM

## 2015-03-16 DIAGNOSIS — E103299 Type 1 diabetes mellitus with mild nonproliferative diabetic retinopathy without macular edema, unspecified eye: Secondary | ICD-10-CM | POA: Diagnosis present

## 2015-03-16 DIAGNOSIS — E109 Type 1 diabetes mellitus without complications: Secondary | ICD-10-CM | POA: Insufficient documentation

## 2015-03-16 DIAGNOSIS — R1110 Vomiting, unspecified: Secondary | ICD-10-CM

## 2015-03-16 DIAGNOSIS — E875 Hyperkalemia: Secondary | ICD-10-CM | POA: Diagnosis present

## 2015-03-16 DIAGNOSIS — R6520 Severe sepsis without septic shock: Secondary | ICD-10-CM | POA: Diagnosis present

## 2015-03-16 DIAGNOSIS — R509 Fever, unspecified: Secondary | ICD-10-CM

## 2015-03-16 DIAGNOSIS — K529 Noninfective gastroenteritis and colitis, unspecified: Secondary | ICD-10-CM | POA: Diagnosis present

## 2015-03-16 DIAGNOSIS — N179 Acute kidney failure, unspecified: Secondary | ICD-10-CM | POA: Diagnosis present

## 2015-03-16 DIAGNOSIS — I129 Hypertensive chronic kidney disease with stage 1 through stage 4 chronic kidney disease, or unspecified chronic kidney disease: Secondary | ICD-10-CM | POA: Diagnosis present

## 2015-03-16 DIAGNOSIS — E1065 Type 1 diabetes mellitus with hyperglycemia: Secondary | ICD-10-CM | POA: Diagnosis present

## 2015-03-16 DIAGNOSIS — R109 Unspecified abdominal pain: Secondary | ICD-10-CM

## 2015-03-16 HISTORY — DX: Chronic kidney disease, stage 3 (moderate): N18.3

## 2015-03-16 HISTORY — DX: Essential (primary) hypertension: I10

## 2015-03-16 HISTORY — DX: Type 1 diabetes mellitus with mild nonproliferative diabetic retinopathy without macular edema, unspecified eye: E10.3299

## 2015-03-16 LAB — COMPREHENSIVE METABOLIC PANEL
ALT: 48 U/L (ref 17–63)
AST: 39 U/L (ref 15–41)
Albumin: 4.7 g/dL (ref 3.5–5.0)
Alkaline Phosphatase: 54 U/L (ref 38–126)
Anion gap: 15 (ref 5–15)
BILIRUBIN TOTAL: 1.4 mg/dL — AB (ref 0.3–1.2)
BUN: 44 mg/dL — AB (ref 6–20)
CO2: 20 mmol/L — ABNORMAL LOW (ref 22–32)
CREATININE: 2.08 mg/dL — AB (ref 0.61–1.24)
Calcium: 9.6 mg/dL (ref 8.9–10.3)
Chloride: 103 mmol/L (ref 101–111)
GFR calc Af Amer: 47 mL/min — ABNORMAL LOW (ref >60)
GFR, EST NON AFRICAN AMERICAN: 40 mL/min — AB (ref >60)
Glucose, Bld: 325 mg/dL — ABNORMAL HIGH (ref 65–99)
POTASSIUM: 5.2 mmol/L — AB (ref 3.5–5.1)
Sodium: 138 mmol/L (ref 135–145)
TOTAL PROTEIN: 8.4 g/dL — AB (ref 6.5–8.1)

## 2015-03-16 LAB — BLOOD GAS, VENOUS
Acid-base deficit: 5.4 mmol/L — ABNORMAL HIGH (ref 0.0–2.0)
BICARBONATE: 19.7 meq/L — AB (ref 20.0–24.0)
FIO2: 0.21
O2 Saturation: 84.9 %
PCO2 VEN: 42.5 mmHg — AB (ref 45.0–50.0)
PH VEN: 7.298 (ref 7.250–7.300)
PO2 VEN: 62.7 mmHg — AB (ref 30.0–45.0)
Patient temperature: 101.3
TCO2: 18.6 mmol/L (ref 0–100)

## 2015-03-16 LAB — URINE MICROSCOPIC-ADD ON
BACTERIA UA: NONE SEEN
SQUAMOUS EPITHELIAL / LPF: NONE SEEN

## 2015-03-16 LAB — URINALYSIS, ROUTINE W REFLEX MICROSCOPIC
BILIRUBIN URINE: NEGATIVE
Glucose, UA: >1000 mg/dL — AB
Ketones, ur: 15 mg/dL — AB
Leukocytes, UA: NEGATIVE
Nitrite: NEGATIVE
PROTEIN: 30 mg/dL — AB
Specific Gravity, Urine: 1.019 (ref 1.005–1.030)
pH: 5.5 (ref 5.0–8.0)

## 2015-03-16 MED ORDER — ONDANSETRON HCL 4 MG/2ML IJ SOLN
4.0000 mg | Freq: Once | INTRAMUSCULAR | Status: AC
  Administered 2015-03-16: 4 mg via INTRAVENOUS
  Filled 2015-03-16: qty 2

## 2015-03-16 MED ORDER — HYDROMORPHONE HCL 1 MG/ML IJ SOLN
1.0000 mg | Freq: Once | INTRAMUSCULAR | Status: AC
  Administered 2015-03-16: 1 mg via INTRAVENOUS
  Filled 2015-03-16: qty 1

## 2015-03-16 MED ORDER — ACETAMINOPHEN 325 MG PO TABS
650.0000 mg | ORAL_TABLET | Freq: Once | ORAL | Status: AC | PRN
  Administered 2015-03-16: 650 mg via ORAL
  Filled 2015-03-16: qty 2

## 2015-03-16 MED ORDER — INSULIN ASPART 100 UNIT/ML ~~LOC~~ SOLN
5.0000 [IU] | Freq: Once | SUBCUTANEOUS | Status: AC
  Administered 2015-03-17: 5 [IU] via SUBCUTANEOUS
  Filled 2015-03-16: qty 1

## 2015-03-16 MED ORDER — PANTOPRAZOLE SODIUM 40 MG IV SOLR
40.0000 mg | Freq: Once | INTRAVENOUS | Status: AC
  Administered 2015-03-16: 40 mg via INTRAVENOUS
  Filled 2015-03-16: qty 40

## 2015-03-16 MED ORDER — SODIUM CHLORIDE 0.9 % IV BOLUS (SEPSIS)
30.0000 mL/kg | Freq: Once | INTRAVENOUS | Status: AC
  Administered 2015-03-16: 2178 mL via INTRAVENOUS

## 2015-03-16 NOTE — ED Provider Notes (Addendum)
CSN: [id number]     Arrival date & time 03/16/15  1939 History   First MD Initiated Contact with Patient 03/16/15 2241     Chief Complaint  Patient presents with  . Emesis    flu like symptoms  . Fever     (Consider location/radiation/quality/duration/timing/severity/associated sxs/prior Treatment) HPI She reports he has diabetes. He reports he is compliant with his medications. He reports after returning home from work this morning he started developing vomiting. He reports he threw up multiple times. He reports he also feels generally achy all over. He reports he has pain in his central abdomen that is aching in quality. He has had no associated diarrhea. He reports that due to being ill, he has not taken his insulin today. He has not been experiencing any pain burning or urgency with urination. He has had no rashes or skin lesions. Past Medical History  Diagnosis Date  . Diabetes mellitus without complication   . Diabetes mellitus type I     onset age 55  . Hypertension   . Hyperlipidemia    Past Surgical History  Procedure Laterality Date  . Lung surgery  1996    Right Lung    Family History  Problem Relation Age of Onset  . Diabetes Mother     DMII  . Hypertension Mother   . Diabetes Father     DMII  . Hypertension Father   . Hyperlipidemia Father   . Depression Father    Social History  Substance Use Topics  . Smoking status: Never Smoker   . Smokeless tobacco: Not on file  . Alcohol Use: 0.0 oz/week    0 Standard drinks or equivalent per week     Comment: Beer - ocassionally     Review of Systems 10 Systems reviewed and are negative for acute change except as noted in the HPI.    Allergies  Review of patient's allergies indicates no known allergies.  Home Medications   Prior to Admission medications   Medication Sig Start Date End Date Taking? Authorizing Provider  amLODipine (NORVASC) 5 MG tablet TAKE 1 TABLET(5 MG) BY MOUTH DAILY 01/14/15  Yes Ethelda Chick, MD  hydrochlorothiazide (HYDRODIURIL) 12.5 MG tablet TAKE 1 TABLET(12.5 MG) BY MOUTH DAILY 01/14/15  Yes Ethelda Chick, MD  insulin aspart (NOVOLOG FLEXPEN) 100 UNIT/ML FlexPen Inject 8 Units into the skin 3 (three) times daily with meals. And pen needles 4/day 09/15/14  Yes Romero Belling, MD  Insulin Glargine (LANTUS SOLOSTAR) 100 UNIT/ML Solostar Pen Inject 13 units at bed time. 11/22/14  Yes Romero Belling, MD  lisinopril (PRINIVIL,ZESTRIL) 10 MG tablet TAKE 1 TABLET(10 MG) BY MOUTH DAILY 01/14/15  Yes Ethelda Chick, MD  simvastatin (ZOCOR) 5 MG tablet TAKE 1 TABLET(5 MG) BY MOUTH DAILY 01/14/15  Yes Ethelda Chick, MD  glucagon (GLUCAGON EMERGENCY) 1 MG injection Inject 1 mg into the muscle once as needed. 06/30/14   Romero Belling, MD   BP 146/84 mmHg  Pulse 95  Temp(Src) 101.3 F (38.5 C) (Oral)  Resp 24  Wt 160 lb 0.9 oz (72.6 kg)  SpO2 99% Physical Exam  Constitutional: He is oriented to person, place, and time. He appears well-developed and well-nourished.  Patient appears very uncomfortable and mildly ill. He is nontoxic and alert. No respiratory distress.  HENT:  Head: Normocephalic and atraumatic.  Nose: Nose normal.  Mouth/Throat: Oropharynx is clear and moist.  Eyes: EOM are normal. Pupils are equal, round, and reactive  to light. No scleral icterus.  Neck: Neck supple.  Cardiovascular: Normal rate, regular rhythm, normal heart sounds and intact distal pulses.   Pulmonary/Chest: Effort normal and breath sounds normal.  Abdominal: Soft. Bowel sounds are normal. He exhibits no distension. There is tenderness.  Diffuse mild tenderness to palpation. No guarding or rebound.  Musculoskeletal: Normal range of motion. He exhibits no edema or tenderness.  Neurological: He is alert and oriented to person, place, and time. He has normal strength. No cranial nerve deficit. He exhibits normal muscle tone. Coordination normal. GCS eye subscore is 4. GCS verbal subscore is 5. GCS motor  subscore is 6.  Skin: Skin is warm, dry and intact.  Psychiatric: He has a normal mood and affect.    ED Course  Procedures (including critical care time) CRITICAL CARE Performed by: Arby Barrette   Total critical care time: 30 minutes  Critical care time was exclusive of separately billable procedures and treating other patients.  Critical care was necessary to treat or prevent imminent or life-threatening deterioration.  Critical care was time spent personally by me on the following activities: development of treatment plan with patient and/or surrogate as well as nursing, discussions with consultants, evaluation of patient's response to treatment, examination of patient, obtaining history from patient or surrogate, ordering and performing treatments and interventions, ordering and review of laboratory studies, ordering and review of radiographic studies, pulse oximetry and re-evaluation of patient's condition. Labs Review Labs Reviewed  COMPREHENSIVE METABOLIC PANEL - Abnormal; Notable for the following:    Potassium 5.2 (*)    CO2 20 (*)    Glucose, Bld 325 (*)    BUN 44 (*)    Creatinine, Ser 2.08 (*)    Total Protein 8.4 (*)    Total Bilirubin 1.4 (*)    GFR calc non Af Amer 40 (*)    GFR calc Af Amer 47 (*)    All other components within normal limits  URINALYSIS, ROUTINE W REFLEX MICROSCOPIC (NOT AT [facility]) - Abnormal; Notable for the following:    Glucose, UA >1000 (*)    Hgb urine dipstick TRACE (*)    Ketones, ur 15 (*)    Protein, ur 30 (*)    All other components within normal limits  URINE MICROSCOPIC-ADD ON - Abnormal; Notable for the following:    Casts HYALINE CASTS (*)    All other components within normal limits  BLOOD GAS, VENOUS - Abnormal; Notable for the following:    pCO2, Ven 42.5 (*)    pO2, Ven 62.7 (*)    Bicarbonate 19.7 (*)    Acid-base deficit 5.4 (*)    All other components within normal limits  URINE CULTURE    Imaging Review No  results found. I have personally reviewed and evaluated these images and lab results as part of my medical decision-making.   EKG Interpretation None     Consult: Patient's case was reviewed with the hospitalist for admission. MDM   Final diagnoses:  Intractable vomiting with nausea, vomiting of unspecified type  Generalized abdominal pain  Fever, unspecified fever cause  Type 1 diabetes mellitus without complication (HCC)   Patient presents with recurrent vomiting and fever. He is a type I diabetic. Fluid resuscitation has been initiated. Patient has very mild acidosis. His mental status is clear and he has no respiratory distress. Patient does have diffuse abdominal pain. He will be admitted for ongoing management with fluids, insulin and further diagnostic studies.    Arby Barrette,  MD 03/16/15 2357  Arby Barrette, MD 03/16/15 2358

## 2015-03-16 NOTE — ED Notes (Signed)
Pt from home via EMS. Pt got home from work today and began having complaints of nausea, emesis, and generalized body aches.

## 2015-03-16 NOTE — ED Notes (Signed)
MD at bedside. 

## 2015-03-17 ENCOUNTER — Inpatient Hospital Stay (HOSPITAL_COMMUNITY): Payer: Managed Care, Other (non HMO)

## 2015-03-17 ENCOUNTER — Encounter (HOSPITAL_COMMUNITY)
Admission: EM | Disposition: A | Discharge status: Home / Self Care | Payer: Self-pay | Source: Home / Self Care | Attending: Family Medicine

## 2015-03-17 ENCOUNTER — Encounter (HOSPITAL_COMMUNITY): Payer: Self-pay | Admitting: Family Medicine

## 2015-03-17 ENCOUNTER — Inpatient Hospital Stay (HOSPITAL_COMMUNITY): Payer: Managed Care, Other (non HMO) | Admitting: Anesthesiology

## 2015-03-17 DIAGNOSIS — N183 Chronic kidney disease, stage 3 (moderate): Secondary | ICD-10-CM | POA: Diagnosis not present

## 2015-03-17 DIAGNOSIS — E1065 Type 1 diabetes mellitus with hyperglycemia: Secondary | ICD-10-CM | POA: Diagnosis present

## 2015-03-17 DIAGNOSIS — K3580 Unspecified acute appendicitis: Secondary | ICD-10-CM | POA: Diagnosis not present

## 2015-03-17 DIAGNOSIS — A419 Sepsis, unspecified organism: Secondary | ICD-10-CM | POA: Insufficient documentation

## 2015-03-17 DIAGNOSIS — A09 Infectious gastroenteritis and colitis, unspecified: Secondary | ICD-10-CM | POA: Diagnosis present

## 2015-03-17 DIAGNOSIS — R1110 Vomiting, unspecified: Secondary | ICD-10-CM | POA: Insufficient documentation

## 2015-03-17 DIAGNOSIS — N179 Acute kidney failure, unspecified: Secondary | ICD-10-CM | POA: Diagnosis present

## 2015-03-17 DIAGNOSIS — R109 Unspecified abdominal pain: Secondary | ICD-10-CM | POA: Insufficient documentation

## 2015-03-17 DIAGNOSIS — R1010 Upper abdominal pain, unspecified: Secondary | ICD-10-CM

## 2015-03-17 DIAGNOSIS — K529 Noninfective gastroenteritis and colitis, unspecified: Secondary | ICD-10-CM | POA: Diagnosis present

## 2015-03-17 HISTORY — PX: LAPAROSCOPIC APPENDECTOMY: SHX408

## 2015-03-17 LAB — CBC WITH DIFFERENTIAL/PLATELET
BASOS ABS: 0 10*3/uL (ref 0.0–0.1)
BASOS PCT: 0 %
BASOS PCT: 0 %
Basophils Absolute: 0 10*3/uL (ref 0.0–0.1)
EOS ABS: 0 10*3/uL (ref 0.0–0.7)
Eosinophils Absolute: 0 10*3/uL (ref 0.0–0.7)
Eosinophils Relative: 0 %
Eosinophils Relative: 0 %
HCT: 31.1 % — ABNORMAL LOW (ref 39.0–52.0)
HEMATOCRIT: 30.8 % — AB (ref 39.0–52.0)
HEMOGLOBIN: 10.3 g/dL — AB (ref 13.0–17.0)
Hemoglobin: 10.2 g/dL — ABNORMAL LOW (ref 13.0–17.0)
LYMPHS PCT: 6 %
Lymphocytes Relative: 6 %
Lymphs Abs: 1.1 10*3/uL (ref 0.7–4.0)
Lymphs Abs: 1.1 10*3/uL (ref 0.7–4.0)
MCH: 28.9 pg (ref 26.0–34.0)
MCH: 29.4 pg (ref 26.0–34.0)
MCHC: 32.8 g/dL (ref 30.0–36.0)
MCHC: 33.4 g/dL (ref 30.0–36.0)
MCV: 88 fL (ref 78.0–100.0)
MCV: 88.1 fL (ref 78.0–100.0)
MONO ABS: 0.9 10*3/uL (ref 0.1–1.0)
MONOS PCT: 5 %
Monocytes Absolute: 1.1 10*3/uL — ABNORMAL HIGH (ref 0.1–1.0)
Monocytes Relative: 6 %
NEUTROS ABS: 16.3 10*3/uL — AB (ref 1.7–7.7)
NEUTROS PCT: 88 %
Neutro Abs: 17 10*3/uL — ABNORMAL HIGH (ref 1.7–7.7)
Neutrophils Relative %: 89 %
Platelets: 333 10*3/uL (ref 150–400)
Platelets: 337 10*3/uL (ref 150–400)
RBC: 3.5 MIL/uL — ABNORMAL LOW (ref 4.22–5.81)
RBC: 3.53 MIL/uL — ABNORMAL LOW (ref 4.22–5.81)
RDW: 13.5 % (ref 11.5–15.5)
RDW: 13.5 % (ref 11.5–15.5)
WBC: 18.5 10*3/uL — ABNORMAL HIGH (ref 4.0–10.5)
WBC: 19 10*3/uL — ABNORMAL HIGH (ref 4.0–10.5)

## 2015-03-17 LAB — COMPREHENSIVE METABOLIC PANEL
ALBUMIN: 3.8 g/dL (ref 3.5–5.0)
ALK PHOS: 43 U/L (ref 38–126)
ALT: 36 U/L (ref 17–63)
AST: 24 U/L (ref 15–41)
Anion gap: 11 (ref 5–15)
BUN: 51 mg/dL — AB (ref 6–20)
CALCIUM: 8.7 mg/dL — AB (ref 8.9–10.3)
CO2: 20 mmol/L — AB (ref 22–32)
CREATININE: 2.34 mg/dL — AB (ref 0.61–1.24)
Chloride: 108 mmol/L (ref 101–111)
GFR calc Af Amer: 40 mL/min — ABNORMAL LOW (ref >60)
GFR calc non Af Amer: 35 mL/min — ABNORMAL LOW (ref >60)
GLUCOSE: 346 mg/dL — AB (ref 65–99)
Potassium: 4.9 mmol/L (ref 3.5–5.1)
SODIUM: 139 mmol/L (ref 135–145)
Total Bilirubin: 1.1 mg/dL (ref 0.3–1.2)
Total Protein: 6.7 g/dL (ref 6.5–8.1)

## 2015-03-17 LAB — CBG MONITORING, ED
GLUCOSE-CAPILLARY: 297 mg/dL — AB (ref 65–99)
GLUCOSE-CAPILLARY: 291 mg/dL — AB (ref 65–99)
Glucose-Capillary: 250 mg/dL — ABNORMAL HIGH (ref 65–99)
Glucose-Capillary: 304 mg/dL — ABNORMAL HIGH (ref 65–99)
Glucose-Capillary: 410 mg/dL — ABNORMAL HIGH (ref 65–99)

## 2015-03-17 LAB — APTT: aPTT: 27 seconds (ref 24–37)

## 2015-03-17 LAB — GLUCOSE, CAPILLARY
GLUCOSE-CAPILLARY: 168 mg/dL — AB (ref 65–99)
GLUCOSE-CAPILLARY: 78 mg/dL (ref 65–99)
GLUCOSE-CAPILLARY: 144 mg/dL — AB (ref 65–99)
GLUCOSE-CAPILLARY: 154 mg/dL — AB (ref 65–99)
Glucose-Capillary: 97 mg/dL (ref 65–99)
Glucose-Capillary: 125 mg/dL — ABNORMAL HIGH (ref 65–99)
Glucose-Capillary: 132 mg/dL — ABNORMAL HIGH (ref 65–99)

## 2015-03-17 LAB — PROTIME-INR
INR: 1.06 (ref 0.00–1.49)
PROTHROMBIN TIME: 14 s (ref 11.6–15.2)

## 2015-03-17 LAB — PROCALCITONIN: Procalcitonin: 0.26 ng/mL

## 2015-03-17 LAB — LACTIC ACID, PLASMA
Lactic Acid, Venous: 1.7 mmol/L (ref 0.5–2.0)
Lactic Acid, Venous: 1.1 mmol/L (ref 0.5–2.0)

## 2015-03-17 SURGERY — APPENDECTOMY, LAPAROSCOPIC
Anesthesia: General | Site: Abdomen

## 2015-03-17 MED ORDER — CISATRACURIUM BESYLATE (PF) 10 MG/5ML IV SOLN
INTRAVENOUS | Status: DC | PRN
  Administered 2015-03-17: 8 mg via INTRAVENOUS

## 2015-03-17 MED ORDER — ROCURONIUM BROMIDE 100 MG/10ML IV SOLN
INTRAVENOUS | Status: AC
  Filled 2015-03-17: qty 1

## 2015-03-17 MED ORDER — SUCCINYLCHOLINE CHLORIDE 20 MG/ML IJ SOLN
INTRAMUSCULAR | Status: DC | PRN
  Administered 2015-03-17: 100 mg via INTRAVENOUS

## 2015-03-17 MED ORDER — LACTATED RINGERS IV BOLUS (SEPSIS): 1000.0000 mL | Freq: Three times a day (TID) | INTRAVENOUS | Status: DC | PRN

## 2015-03-17 MED ORDER — CHLORHEXIDINE GLUCONATE 4 % EX LIQD: 1.0000 "application " | Freq: Once | CUTANEOUS | Status: DC

## 2015-03-17 MED ORDER — ONDANSETRON HCL 4 MG/2ML IJ SOLN: 4.0000 mg | Freq: Four times a day (QID) | INTRAMUSCULAR | Status: DC | PRN

## 2015-03-17 MED ORDER — FENTANYL CITRATE (PF) 100 MCG/2ML IJ SOLN
INTRAMUSCULAR | Status: DC | PRN
  Administered 2015-03-17 (×2): 100 ug via INTRAVENOUS

## 2015-03-17 MED ORDER — ONDANSETRON HCL 4 MG PO TABS: 4.0000 mg | ORAL_TABLET | Freq: Four times a day (QID) | ORAL | Status: DC | PRN

## 2015-03-17 MED ORDER — INSULIN ASPART 100 UNIT/ML ~~LOC~~ SOLN
0.0000 [IU] | Freq: Three times a day (TID) | SUBCUTANEOUS | Status: DC
  Administered 2015-03-17: 1 [IU] via SUBCUTANEOUS
  Administered 2015-03-18 (×2): 3 [IU] via SUBCUTANEOUS

## 2015-03-17 MED ORDER — ONDANSETRON 4 MG PO TBDP: 4.0000 mg | ORAL_TABLET | Freq: Four times a day (QID) | ORAL | Status: DC | PRN

## 2015-03-17 MED ORDER — INSULIN GLARGINE 100 UNIT/ML ~~LOC~~ SOLN
13.0000 [IU] | Freq: Every day | SUBCUTANEOUS | Status: DC
  Administered 2015-03-17: 13 [IU] via SUBCUTANEOUS
  Filled 2015-03-17: qty 0.13

## 2015-03-17 MED ORDER — AMLODIPINE BESYLATE 10 MG PO TABS
10.0000 mg | ORAL_TABLET | Freq: Every day | ORAL | Status: DC
Start: 2015-03-17 — End: 2015-03-18
  Filled 2015-03-17 (×2): qty 1

## 2015-03-17 MED ORDER — HYDROMORPHONE HCL 1 MG/ML IJ SOLN
0.5000 mg | INTRAMUSCULAR | Status: DC | PRN
  Filled 2015-03-17: qty 2

## 2015-03-17 MED ORDER — DEXTROSE-NACL 5-0.45 % IV SOLN
INTRAVENOUS | Status: DC
  Administered 2015-03-17 – 2015-03-18 (×3): via INTRAVENOUS

## 2015-03-17 MED ORDER — INSULIN GLARGINE 100 UNIT/ML ~~LOC~~ SOLN
25.0000 [IU] | Freq: Every day | SUBCUTANEOUS | Status: DC
  Administered 2015-03-17: 25 [IU] via SUBCUTANEOUS
  Filled 2015-03-17 (×2): qty 0.25

## 2015-03-17 MED ORDER — MENTHOL 3 MG MT LOZG
1.0000 | LOZENGE | OROMUCOSAL | Status: DC | PRN
  Filled 2015-03-17: qty 9

## 2015-03-17 MED ORDER — MAGIC MOUTHWASH
15.0000 mL | Freq: Four times a day (QID) | ORAL | Status: DC | PRN
  Filled 2015-03-17: qty 15

## 2015-03-17 MED ORDER — BUPIVACAINE-EPINEPHRINE 0.5% -1:200000 IJ SOLN
INTRAMUSCULAR | Status: DC | PRN
  Administered 2015-03-17: 18 mL

## 2015-03-17 MED ORDER — SODIUM CHLORIDE 0.9 % IJ SOLN
3.0000 mL | Freq: Two times a day (BID) | INTRAMUSCULAR | Status: DC
  Administered 2015-03-17: 3 mL via INTRAVENOUS

## 2015-03-17 MED ORDER — LIDOCAINE HCL (CARDIAC) 20 MG/ML IV SOLN
INTRAVENOUS | Status: DC | PRN
  Administered 2015-03-17: 50 mg via INTRAVENOUS

## 2015-03-17 MED ORDER — SODIUM CHLORIDE 0.9 % IV SOLN
8.0000 mg | Freq: Four times a day (QID) | INTRAVENOUS | Status: DC | PRN
  Filled 2015-03-17: qty 4

## 2015-03-17 MED ORDER — PIPERACILLIN-TAZOBACTAM 3.375 G IVPB
3.3750 g | Freq: Three times a day (TID) | INTRAVENOUS | Status: DC
  Administered 2015-03-17: 3.375 g via INTRAVENOUS

## 2015-03-17 MED ORDER — PHENYLEPHRINE HCL 10 MG/ML IJ SOLN
INTRAMUSCULAR | Status: DC | PRN
  Administered 2015-03-17: 100 ug via INTRAVENOUS
  Administered 2015-03-17: 80 ug via INTRAVENOUS
  Administered 2015-03-17 (×2): 100 ug via INTRAVENOUS

## 2015-03-17 MED ORDER — NEOSTIGMINE METHYLSULFATE 10 MG/10ML IV SOLN
INTRAVENOUS | Status: AC
  Filled 2015-03-17: qty 1

## 2015-03-17 MED ORDER — HEPARIN SODIUM (PORCINE) 5000 UNIT/ML IJ SOLN
5000.0000 [IU] | Freq: Three times a day (TID) | INTRAMUSCULAR | Status: DC
  Filled 2015-03-17 (×4): qty 1

## 2015-03-17 MED ORDER — ACETAMINOPHEN 325 MG PO TABS: 650.0000 mg | ORAL_TABLET | Freq: Four times a day (QID) | ORAL | Status: DC | PRN

## 2015-03-17 MED ORDER — INSULIN ASPART 100 UNIT/ML ~~LOC~~ SOLN: 0.0000 [IU] | Freq: Three times a day (TID) | SUBCUTANEOUS | Status: DC

## 2015-03-17 MED ORDER — PANTOPRAZOLE SODIUM 40 MG IV SOLR
40.0000 mg | Freq: Every day | INTRAVENOUS | Status: DC
  Administered 2015-03-17: 40 mg via INTRAVENOUS
  Filled 2015-03-17 (×2): qty 40

## 2015-03-17 MED ORDER — ALUM & MAG HYDROXIDE-SIMETH 200-200-20 MG/5ML PO SUSP: 30.0000 mL | Freq: Four times a day (QID) | ORAL | Status: DC | PRN

## 2015-03-17 MED ORDER — POLYETHYLENE GLYCOL 3350 17 G PO PACK: 17.0000 g | PACK | Freq: Every day | ORAL | Status: DC | PRN

## 2015-03-17 MED ORDER — SODIUM CHLORIDE 0.9 % IV SOLN
INTRAVENOUS | Status: DC
  Administered 2015-03-17: 12:00:00 via INTRAVENOUS

## 2015-03-17 MED ORDER — INSULIN ASPART 100 UNIT/ML ~~LOC~~ SOLN: 4.0000 [IU] | Freq: Three times a day (TID) | SUBCUTANEOUS | Status: DC

## 2015-03-17 MED ORDER — PROMETHAZINE HCL 25 MG/ML IJ SOLN
6.2500 mg | INTRAMUSCULAR | Status: DC | PRN
  Filled 2015-03-17: qty 1

## 2015-03-17 MED ORDER — PROPOFOL 10 MG/ML IV BOLUS
INTRAVENOUS | Status: AC
  Filled 2015-03-17: qty 20

## 2015-03-17 MED ORDER — MIDAZOLAM HCL 2 MG/2ML IJ SOLN
INTRAMUSCULAR | Status: AC
  Filled 2015-03-17: qty 2

## 2015-03-17 MED ORDER — LACTATED RINGERS IV BOLUS (SEPSIS)
1000.0000 mL | Freq: Once | INTRAVENOUS | Status: AC
  Administered 2015-03-17: 1000 mL via INTRAVENOUS

## 2015-03-17 MED ORDER — GLYCOPYRROLATE 0.2 MG/ML IJ SOLN
INTRAMUSCULAR | Status: AC
  Filled 2015-03-17: qty 4

## 2015-03-17 MED ORDER — ACETAMINOPHEN 650 MG RE SUPP: 650.0000 mg | Freq: Four times a day (QID) | RECTAL | Status: DC | PRN

## 2015-03-17 MED ORDER — INSULIN REGULAR BOLUS VIA INFUSION
0.0000 [IU] | Freq: Three times a day (TID) | INTRAVENOUS | Status: DC
  Filled 2015-03-17: qty 10

## 2015-03-17 MED ORDER — LIP MEDEX EX OINT
1.0000 "application " | TOPICAL_OINTMENT | Freq: Two times a day (BID) | CUTANEOUS | Status: DC
  Administered 2015-03-17: 1 via TOPICAL
  Filled 2015-03-17 (×2): qty 7

## 2015-03-17 MED ORDER — SODIUM CHLORIDE 0.9 % IV BOLUS (SEPSIS)
500.0000 mL | Freq: Once | INTRAVENOUS | Status: AC
  Administered 2015-03-17: 500 mL via INTRAVENOUS

## 2015-03-17 MED ORDER — DEXTROSE 50 % IV SOLN: 25.0000 mL | INTRAVENOUS | Status: DC | PRN

## 2015-03-17 MED ORDER — LIDOCAINE HCL (CARDIAC) 20 MG/ML IV SOLN
INTRAVENOUS | Status: AC
  Filled 2015-03-17: qty 5

## 2015-03-17 MED ORDER — NEOSTIGMINE METHYLSULFATE 10 MG/10ML IV SOLN
INTRAVENOUS | Status: DC | PRN
  Administered 2015-03-17: 4 mg via INTRAVENOUS

## 2015-03-17 MED ORDER — GLYCOPYRROLATE 0.2 MG/ML IJ SOLN
INTRAMUSCULAR | Status: DC | PRN
  Administered 2015-03-17: .7 mg via INTRAVENOUS

## 2015-03-17 MED ORDER — HEPARIN SODIUM (PORCINE) 5000 UNIT/ML IJ SOLN
5000.0000 [IU] | Freq: Three times a day (TID) | INTRAMUSCULAR | Status: DC
  Administered 2015-03-18: 5000 [IU] via SUBCUTANEOUS
  Filled 2015-03-17 (×4): qty 1

## 2015-03-17 MED ORDER — LACTATED RINGERS IV SOLN
INTRAVENOUS | Status: DC
  Administered 2015-03-17: 1000 mL via INTRAVENOUS
  Administered 2015-03-17: 11:00:00 via INTRAVENOUS

## 2015-03-17 MED ORDER — HYDROMORPHONE HCL 1 MG/ML IJ SOLN: 0.5000 mg | INTRAMUSCULAR | Status: DC | PRN

## 2015-03-17 MED ORDER — LACTATED RINGERS IR SOLN
Status: DC | PRN
  Administered 2015-03-17: 1000 mL

## 2015-03-17 MED ORDER — PROPOFOL 10 MG/ML IV BOLUS
INTRAVENOUS | Status: DC | PRN
  Administered 2015-03-17: 200 mg via INTRAVENOUS

## 2015-03-17 MED ORDER — METRONIDAZOLE IN NACL 5-0.79 MG/ML-% IV SOLN
500.0000 mg | Freq: Four times a day (QID) | INTRAVENOUS | Status: DC
  Administered 2015-03-17: 500 mg via INTRAVENOUS
  Filled 2015-03-17 (×2): qty 100

## 2015-03-17 MED ORDER — DEXTROSE 5 % IV SOLN
2.0000 g | INTRAVENOUS | Status: DC
  Administered 2015-03-17: 2 g via INTRAVENOUS
  Filled 2015-03-17: qty 2

## 2015-03-17 MED ORDER — BUPIVACAINE-EPINEPHRINE (PF) 0.5% -1:200000 IJ SOLN
INTRAMUSCULAR | Status: AC
  Filled 2015-03-17: qty 30

## 2015-03-17 MED ORDER — HYDROCODONE-ACETAMINOPHEN 5-325 MG PO TABS
1.0000 | ORAL_TABLET | ORAL | Status: DC | PRN
  Administered 2015-03-18: 1 via ORAL
  Filled 2015-03-17: qty 1

## 2015-03-17 MED ORDER — HYDROMORPHONE HCL 1 MG/ML IJ SOLN: 0.2500 mg | INTRAMUSCULAR | Status: DC | PRN

## 2015-03-17 MED ORDER — ONDANSETRON HCL 4 MG/2ML IJ SOLN
INTRAMUSCULAR | Status: AC
  Filled 2015-03-17: qty 2

## 2015-03-17 MED ORDER — PHENOL 1.4 % MT LIQD
2.0000 | OROMUCOSAL | Status: DC | PRN
  Filled 2015-03-17: qty 177

## 2015-03-17 MED ORDER — CISATRACURIUM BESYLATE 20 MG/10ML IV SOLN
INTRAVENOUS | Status: AC
  Filled 2015-03-17: qty 10

## 2015-03-17 MED ORDER — 0.9 % SODIUM CHLORIDE (POUR BTL) OPTIME
TOPICAL | Status: DC | PRN
  Administered 2015-03-17: 1000 mL

## 2015-03-17 MED ORDER — DIPHENHYDRAMINE HCL 50 MG/ML IJ SOLN: 12.5000 mg | Freq: Four times a day (QID) | INTRAMUSCULAR | Status: DC | PRN

## 2015-03-17 MED ORDER — PIPERACILLIN-TAZOBACTAM 3.375 G IVPB
3.3750 g | Freq: Three times a day (TID) | INTRAVENOUS | Status: DC
Start: 2015-03-17 — End: 2015-03-18
  Administered 2015-03-17 – 2015-03-18 (×3): 3.375 g via INTRAVENOUS
  Filled 2015-03-17 (×4): qty 50

## 2015-03-17 MED ORDER — MIDAZOLAM HCL 5 MG/5ML IJ SOLN
INTRAMUSCULAR | Status: DC | PRN
  Administered 2015-03-17: 2 mg via INTRAVENOUS

## 2015-03-17 MED ORDER — EPHEDRINE SULFATE 50 MG/ML IJ SOLN
INTRAMUSCULAR | Status: DC | PRN
  Administered 2015-03-17: 10 mg via INTRAVENOUS
  Administered 2015-03-17: 5 mg via INTRAVENOUS

## 2015-03-17 MED ORDER — SIMVASTATIN 10 MG PO TABS
10.0000 mg | ORAL_TABLET | Freq: Every day | ORAL | Status: DC
  Administered 2015-03-17: 10 mg via ORAL
  Filled 2015-03-17 (×3): qty 1

## 2015-03-17 MED ORDER — ATROPINE SULFATE 0.4 MG/ML IJ SOLN
INTRAMUSCULAR | Status: AC
  Filled 2015-03-17: qty 2

## 2015-03-17 MED ORDER — SODIUM CHLORIDE 0.9 % IV SOLN: INTRAVENOUS | Status: DC

## 2015-03-17 MED ORDER — PROMETHAZINE HCL 25 MG/ML IJ SOLN
6.2500 mg | INTRAMUSCULAR | Status: DC | PRN
Start: 2015-03-17 — End: 2015-03-17

## 2015-03-17 MED ORDER — SODIUM CHLORIDE 0.9 % IV SOLN
INTRAVENOUS | Status: DC
  Administered 2015-03-17: 2.3 [IU]/h via INTRAVENOUS
  Filled 2015-03-17: qty 2.5

## 2015-03-17 MED ORDER — BISACODYL 10 MG RE SUPP: 10.0000 mg | Freq: Every day | RECTAL | Status: DC | PRN

## 2015-03-17 MED ORDER — FENTANYL CITRATE (PF) 250 MCG/5ML IJ SOLN
INTRAMUSCULAR | Status: AC
  Filled 2015-03-17: qty 5

## 2015-03-17 MED ORDER — PIPERACILLIN-TAZOBACTAM 3.375 G IVPB
3.3750 g | Freq: Four times a day (QID) | INTRAVENOUS | Status: DC
  Filled 2015-03-17: qty 50

## 2015-03-17 SURGICAL SUPPLY — 36 items
APPLIER CLIP ROT 10 11.4 M/L (STAPLE)
CABLE HIGH FREQUENCY MONO STRZ (ELECTRODE) ×3 IMPLANT
CHLORAPREP W/TINT 26ML (MISCELLANEOUS) ×3 IMPLANT
CLIP APPLIE ROT 10 11.4 M/L (STAPLE) IMPLANT
CLOSURE WOUND 1/2 X4 (GAUZE/BANDAGES/DRESSINGS)
COVER SURGICAL LIGHT HANDLE (MISCELLANEOUS) ×3 IMPLANT
CUTTER FLEX LINEAR 45M (STAPLE) ×3 IMPLANT
DECANTER SPIKE VIAL GLASS SM (MISCELLANEOUS) ×3 IMPLANT
DEVICE TROCAR PUNCTURE CLOSURE (ENDOMECHANICALS) IMPLANT
DRAPE LAPAROSCOPIC ABDOMINAL (DRAPES) ×3 IMPLANT
ELECT REM PT RETURN 9FT ADLT (ELECTROSURGICAL) ×3
ELECTRODE REM PT RTRN 9FT ADLT (ELECTROSURGICAL) ×1 IMPLANT
ENDOLOOP SUT PDS II  0 18 (SUTURE)
ENDOLOOP SUT PDS II 0 18 (SUTURE) IMPLANT
GAUZE SPONGE 2X2 8PLY STRL LF (GAUZE/BANDAGES/DRESSINGS) IMPLANT
GLOVE EUDERMIC 7 POWDERFREE (GLOVE) ×3 IMPLANT
GOWN STRL REUS W/TWL XL LVL3 (GOWN DISPOSABLE) ×9 IMPLANT
KIT BASIN OR (CUSTOM PROCEDURE TRAY) ×3 IMPLANT
LIQUID BAND (GAUZE/BANDAGES/DRESSINGS) ×3 IMPLANT
POUCH SPECIMEN RETRIEVAL 10MM (ENDOMECHANICALS) ×3 IMPLANT
RELOAD 45 VASCULAR/THIN (ENDOMECHANICALS) IMPLANT
RELOAD STAPLE TA45 3.5 REG BLU (ENDOMECHANICALS) ×3 IMPLANT
SET IRRIG TUBING LAPAROSCOPIC (IRRIGATION / IRRIGATOR) ×3 IMPLANT
SHEARS HARMONIC ACE PLUS 36CM (ENDOMECHANICALS) ×3 IMPLANT
SPONGE GAUZE 2X2 STER 10/PKG (GAUZE/BANDAGES/DRESSINGS)
STRIP CLOSURE SKIN 1/2X4 (GAUZE/BANDAGES/DRESSINGS) IMPLANT
SUT MNCRL AB 4-0 PS2 18 (SUTURE) ×3 IMPLANT
SUT VICRYL 0 UR6 27IN ABS (SUTURE) IMPLANT
TOWEL OR 17X26 10 PK STRL BLUE (TOWEL DISPOSABLE) ×3 IMPLANT
TOWEL OR NON WOVEN STRL DISP B (DISPOSABLE) ×3 IMPLANT
TRAY FOLEY W/METER SILVER 14FR (SET/KITS/TRAYS/PACK) ×3 IMPLANT
TRAY FOLEY W/METER SILVER 16FR (SET/KITS/TRAYS/PACK) ×3 IMPLANT
TRAY LAPAROSCOPIC (CUSTOM PROCEDURE TRAY) ×3 IMPLANT
TROCAR BLADELESS OPT 5 100 (ENDOMECHANICALS) ×3 IMPLANT
TROCAR XCEL 12X100 BLDLESS (ENDOMECHANICALS) ×3 IMPLANT
TROCAR XCEL BLUNT TIP 100MML (ENDOMECHANICALS) ×3 IMPLANT

## 2015-03-17 NOTE — Anesthesia Preprocedure Evaluation (Addendum)
Anesthesia Evaluation  Patient identified by MRN, date of birth, ID band Patient awake    Reviewed: Allergy & Precautions, NPO status , Patient's Chart, lab work & pertinent test results  Airway Mallampati: II  TM Distance: >3 FB Neck ROM: Full    Dental no notable dental hx.    Pulmonary neg pulmonary ROS,    Pulmonary exam normal breath sounds clear to auscultation       Cardiovascular Exercise Tolerance: Good hypertension, Pt. on medications Normal cardiovascular exam Rhythm:Regular Rate:Normal     Neuro/Psych negative neurological ROS  negative psych ROS   GI/Hepatic negative GI ROS, Neg liver ROS,   Endo/Other  diabetes, Type 1, Insulin Dependent  Renal/GU Renal InsufficiencyRenal diseaseCr 2.34 K 4.9  negative genitourinary   Musculoskeletal negative musculoskeletal ROS (+)   Abdominal   Peds negative pediatric ROS (+)  Hematology negative hematology ROS (+)   Anesthesia Other Findings   Reproductive/Obstetrics negative OB ROS                            Anesthesia Physical Anesthesia Plan  ASA: III  Anesthesia Plan: General   Post-op Pain Management:    Induction: Intravenous  Airway Management Planned: Oral ETT  Additional Equipment:   Intra-op Plan:   Post-operative Plan: Extubation in OR  Informed Consent: I have reviewed the patients History and Physical, chart, labs and discussed the procedure including the risks, benefits and alternatives for the proposed anesthesia with the patient or authorized representative who has indicated his/her understanding and acceptance.   Dental advisory given  Plan Discussed with: CRNA  Anesthesia Plan Comments:         Anesthesia Quick Evaluation

## 2015-03-17 NOTE — H&P (Signed)
[facility] History and Physical  Aldred Mase ONG:[id number] DOB: [birth year] DOA: 03/16/2015  Referring physician: ED physician PCP: Nilda Simmer, MD  Specialists:  Dr. Everardo All (endocrine)   Chief Complaint:  Fever, abdominal pain, N/V  HPI: Jeffrey Hale is a 35 y.o. 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 Patient reports being in his usual state of health when he arrived at work this morning, but states that around 8 AM he developed abdominal discomfort. Abdominal symptoms become became more severe over the course of the day and was accompanied by subjective fevers. By the time he arrived home this evening, patient's abdomen was painful and he was vomiting.  Pain is described as constant, severe, localized to the abdomen, and with no alleviating or exacerbating factors identified by the patient. There is no associated diarrhea. He denies chest pain, palpitations, dyspnea, or headache. There has been no recent long-distance travel, and while he acknowledges that a "virus is going around at work," the ill coworkers have had upper respiratory symptoms.   In ED, patient was found to be febrile to  38.5 C, saturating well on room air and with vital signs stable. Initial blood work is notable for acute renal insufficiency, mild hyperkalemia, and mild acidosis. UA featured >1k glucose and 15 ketones, but there was no suggestion of infection.  The patient was bolused with 30 cc/kg NS in the ED, Dilaudid and Zofran were administered, and an IV push of Protonix given.  The patient remained hemodynamically stable in the ED and was admitted to telemetry for ongoing evaluation and management of suspected acute gastroenteritis with mild DKA and AKI.  He is being sent for CT abdomen at this time.   Where does patient live?   At home     Can patient  participate in ADLs?  Yes      Review of Systems:   General:  Fever and chills. No sweats, weight change, poor appetite, or fatigue HEENT: no blurry vision, hearing changes or sore throat Pulm: no dyspnea, cough, or wheeze CV: no chest pain or palpitations Abd:  Nausea, vomiting, abdominal pain.No diarrhea or constipation GU: no dysuria, hematuria, increased urinary frequency, or urgency  Ext: no leg edema Neuro: no focal weakness, numbness, or tingling, no vision change or hearing loss Skin: no rash, no wounds MSK: No muscle spasm, no deformity, no red, hot, or swollen joint Heme: No easy bruising or bleeding Travel history: No recent long distant travel    Allergy: No Known Allergies  Past Medical History  Diagnosis Date  . Diabetes mellitus without complication (HCC)   . Diabetes mellitus type I [facility])     onset age 43  . Hypertension   . Hyperlipidemia     Past Surgical History  Procedure Laterality Date  . Lung surgery  1996    Right Lung     Social History:  reports that he has never smoked. He does not have any smokeless tobacco history on file. He reports that he drinks alcohol. He reports that he does not use illicit drugs.  Family History:  Family History  Problem Relation Age of Onset  . Diabetes Mother     DMII  . Hypertension Mother   . Diabetes Father     DMII  . Hypertension Father   . Hyperlipidemia Father   . Depression Father  Prior to Admission medications   Medication Sig Start Date End Date Taking? Authorizing Provider  amLODipine (NORVASC) 5 MG tablet TAKE 1 TABLET(5 MG) BY MOUTH DAILY 01/14/15  Yes Ethelda Chick, MD  hydrochlorothiazide (HYDRODIURIL) 12.5 MG tablet TAKE 1 TABLET(12.5 MG) BY MOUTH DAILY 01/14/15  Yes Ethelda Chick, MD  insulin aspart (NOVOLOG FLEXPEN) 100 UNIT/ML FlexPen Inject 8 Units into the skin 3 (three) times daily with meals. And pen needles 4/day 09/15/14  Yes Romero Belling, MD  Insulin Glargine (LANTUS SOLOSTAR)  100 UNIT/ML Solostar Pen Inject 13 units at bed time. 11/22/14  Yes Romero Belling, MD  lisinopril (PRINIVIL,ZESTRIL) 10 MG tablet TAKE 1 TABLET(10 MG) BY MOUTH DAILY 01/14/15  Yes Ethelda Chick, MD  simvastatin (ZOCOR) 5 MG tablet TAKE 1 TABLET(5 MG) BY MOUTH DAILY 01/14/15  Yes Ethelda Chick, MD  glucagon (GLUCAGON EMERGENCY) 1 MG injection Inject 1 mg into the muscle once as needed. 06/30/14   Romero Belling, MD    Physical Exam: Filed Vitals:   03/16/15 1942 03/16/15 2258 03/16/15 2300 03/16/15 2316  BP: 127/85   146/84  Pulse: 94   95  Temp: 101.3 F (38.5 C)  99.2 F (37.3 C)   TempSrc: Oral  Oral   Resp: 24     Weight:  72.6 kg (160 lb 0.9 oz)    SpO2: 100%   99%   General: Not in acute respiratory distress, but obvious discomfort  HEENT:       Eyes: PERRL, EOMI, no scleral icterus or conjunctival pallor.       ENT: No discharge from the ears or nose, no pharyngeal ulcers, petechiae or exudate, no tonsillar enlargement.        Neck: No JVD, no bruit, no appreciable mass Heme: No cervical adenopathy, no pallor Cardiac: Rate ~100 and regular with hyperdynamic precordium, No murmurs, No gallops or rubs. Pulm: Good air movement bilaterally. No rales, wheezing, rhonchi or rubs. Abd: Soft, nondistended, periumbilical tenderness with voluntary guarding, no rebound pain, no mass or organomegaly, BS present. Ext: No LE edema bilaterally. 2+DP/PT pulse bilaterally. Musculoskeletal: No gross deformity, no red, hot, swollen joints, no limitation in ROM  Skin: No rashes or wounds on exposed surfaces  Neuro: Alert, oriented X3, cranial nerves II-XII grossly intact. No focal findings Psych: Patient is not overtly psychotic, appropriate mood and affect .  Labs on Admission:  Basic Metabolic Panel:  Recent Labs Lab 03/16/15 1956  NA 138  K 5.2*  CL 103  CO2 20*  GLUCOSE 325*  BUN 44*  CREATININE 2.08*  CALCIUM 9.6   Liver Function Tests:  Recent Labs Lab 03/16/15 1956  AST 39   ALT 48  ALKPHOS 54  BILITOT 1.4*  PROT 8.4*  ALBUMIN 4.7   No results for input(s): LIPASE, AMYLASE in the last 168 hours. No results for input(s): AMMONIA in the last 168 hours. CBC: No results for input(s): WBC, NEUTROABS, HGB, HCT, MCV, PLT in the last 168 hours. Cardiac Enzymes: No results for input(s): CKTOTAL, CKMB, CKMBINDEX, TROPONINI in the last 168 hours.  BNP (last 3 results) No results for input(s): BNP in the last 8760 hours.  ProBNP (last 3 results) No results for input(s): PROBNP in the last 8760 hours.  CBG:  Recent Labs Lab 03/17/15 0005  GLUCAP 410*    Radiological Exams on Admission: No results found.  EKG:    Ordered and pending  Assessment/Plan  1. Severe sepsis d/t acute appendicitis  - CT  findings consistent with acute appendicitis without complication - 30 cc/kg NS bolus given in ED  - Blood cultures obtained  - Empiric Zosyn   - Pain-control  - General surgery consulted, appreciate Dr. Michaell Cowing discussing case, seeing pt - NPO, hold pharmacologic VTE ppx pending surgeon eval    2. 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 Type 1 DM  - Follows with Dr. Everardo All outpt  - Per endocrinologist's notes, sugars have been under good control lately  - Will give 10 units Lantus, takes 13 at home  - CBG q4h while NPO  - Moderate-intensity SSI correctional  - A1c pending  - Carb-modified diet when appropriate  4. Hypertension - Holding pt's lisinopril and HCTZ in setting of AKI with electrolyte derangements  - Continue Norvasc, increase to 10 mg for now  - Hydralazine IV pushes prn SBP >180 or DBP >100    5. Hyperkalemia  - Secondary to AKI and hyperglycemia  -  Anticipate resolution with fluids and insulin  - Will repeat chem panel in am     DVT ppx:  SCDs  Code Status: Full code Family Communication: None at bed side.          Disposition Plan: Admit to inpatient   Date of Service 03/17/2015    Briscoe Deutscher, MD [facility] Pager [phone]  If 7PM-7AM, please contact night-coverage [email or web address] Password [email or web address] 03/17/2015, 12:55 AM

## 2015-03-17 NOTE — ED Notes (Signed)
Per Victorino Dike Engineer, manufacturing systems), order for soap shower cannot be completed due to pt being held in the ER and not on the floor.

## 2015-03-17 NOTE — Progress Notes (Signed)
ANTIBIOTIC CONSULT NOTE - INITIAL  Pharmacy Consult for Zosyn Indication: intra-abdominal infection  No Known Allergies  Patient Measurements: Weight: 160 lb 0.9 oz (72.6 kg) Adjusted Body Weight:   Vital Signs: Temp: 98.3 F (36.8 C) (01/19 0730) Temp Source: Oral (01/18 2300) BP: 117/61 mmHg (01/19 0730) Pulse Rate: 83 (01/19 0730) Intake/Output from previous day:   Intake/Output from this shift:    Labs:  Recent Labs  03/16/15 1956 03/17/15 0341  WBC  --  19.0*  18.5*  HGB  --  10.2*  10.3*  PLT  --  337  333  CREATININE 2.08* 2.34*   CrCl cannot be calculated (Unknown ideal weight.). No results for input(s): VANCOTROUGH, VANCOPEAK, VANCORANDOM, GENTTROUGH, GENTPEAK, GENTRANDOM, TOBRATROUGH, TOBRAPEAK, TOBRARND, AMIKACINPEAK, AMIKACINTROU, AMIKACIN in the last 72 hours.   Microbiology: No results found for this or any previous visit (from the past 720 hour(s)).  Medical History: Past Medical History  Diagnosis Date  . Diabetes mellitus type I [facility])     onset age 35  . Hyperlipidemia   . CKD (chronic kidney disease) stage 3, GFR 30-59 ml/min 08/16/2014  . 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 Dione Booze, MD 07/08/2014     . Essential hypertension, benign 08/16/2014     Assessment: 35 yoM admitted with acute appendicitis, septic. Ceftriaxone and Flagyl and Zosyn given while patient is in ED.  Pharmacy consulted to continue Zosyn. Patient will proceed to OR this AM for appendectomy this morning.  SCr 2.34, CrCl~45 ml/min.  Goal of Therapy:  Doses adjusted per renal function Eradication of infection  Plan:  Zosyn 3.375g IV q8h (4 hour infusion time). F/u SCr, culture results.  Clance Boll 03/17/2015,8:29 AM

## 2015-03-17 NOTE — Op Note (Signed)
Patient Name:           Jeffrey Hale   Date of Surgery:        03/17/2015  Pre op Diagnosis:      Acute appendicitis  Post op Diagnosis:    Acute appendicitis  Procedure:                 Laparoscopic appendectomy  Surgeon:                     Angelia Mould. Derrell Lolling, M.D., FACS  Assistant:                      OR staff  Operative Indications:   This is a 35 year old Hispanic male with a history of type 1 diabetes, nephropathy, retinopathy, hypertension. Also has history of a right lung stripping when he was a child for infection. He presented to the emergency department with a 24-hour history of fever nausea vomiting and abdominal pain. No prior history of similar symptoms. He became more severe and he felt that he was having fevers. Pain is constant. Severe. Localized to the abdomen. No diarrhea. No chest pain or headache.  In the ED he was found to have low-grade fever to 38.5C. Lab work showed leukocytosis with WBC 19,000, hemoglobin 10.2, normal lactic acid, normal calcitonin,   and, potassium 4.9, creatinine 2.34, BUN 51. Normal liver function tests. Urinalysis showed high glucose but no signs of gross infection. CT scan shows acute appendicitis but no evidence of perforation or abscess. Gallstones without evidence of inflammation also noted.  He was admitted by the medicine department. He has been started on Rocephin and Flagyl. He is being prepared for surgery for appendectomy.  Operative Findings:       The patient had acute appendicitis.  The appendix was inflamed thickened and edematous but there was no gangrene rupture or abscess.  The terminal ileum, cecum, right colon, and sigmoid colon looks normal.  No free fluid.  Procedure in Detail:          Following the induction of general endotracheal anesthesia the patient's abdomen was prepped and draped in a sterile fashion.  Surgical timeout was performed.  Antibiotics were up-to-date.  0.5% Marcaine with epinephrine  was used as local infiltration anesthetic.  An 11 mm Hassan trocar was placed in the supraumbilical position with open technique and secured with a Purstring suture of 0 Vicryl.  Pneumoperitoneum was created.  Camera was inserted.  An 11 mm trocar was placed in the suprapubic area and 5 mm trocar placed in the left lower quadrant.  We defined the terminal ileum, ileocecal valve, appendix, and cecum.  We divided some of the lateral attachments to the cecum to mobilize it medially.  We then divided the appendiceal mesentery with a Harmonic scalpel in several steps until we could see the junction of the appendix with the cecum.  Endo GIA stapling device on a 3.5 mm load was inserted and placed transversely across the base of the appendix.  Closed.  Held in place for 30 seconds.  Fired and removed.  The appendix was placed in a specimen bag and removed.  A couple of loose staples were removed.  The staple line looked very good.  There was no bleeding.  There was no defect.  The operative field was irrigated.  The trochars were removed.  There was no bleeding.  The pneumoperitoneum was released.  The fascia at the umbilicus was closed  with 0 Vicryl sutures and the skin incisions closed with subcuticular sutures of 4-0 Monocryl and Dermabond.  The patient tolerated the procedure well was taken to PACU in stable condition.  EBL 10 mL.  Counts correct.  Complications none.     Angelia Mould. Derrell Lolling, M.D., FACS General and Minimally Invasive Surgery Breast and Colorectal Surgery  03/17/2015 12:44 PM

## 2015-03-17 NOTE — ED Notes (Signed)
Phlebotomy at bedside.

## 2015-03-17 NOTE — Progress Notes (Addendum)
Inpatient Diabetes Program Recommendations  AACE/ADA: New Consensus Statement on Inpatient Glycemic Control (2015)  Target Ranges:  Prepandial:   less than 140 mg/dL      Peak postprandial:   less than 180 mg/dL (1-2 hours)      Critically ill patients:  140 - 180 mg/dL    Results for LENIN, KUHNLE (MRN [id number]) as of 03/17/2015 11:05  Ref. Range 03/17/2015 00:05 03/17/2015 02:46 03/17/2015 07:10 03/17/2015 08:57 03/17/2015 10:29  Glucose-Capillary Latest Ref Range: 65-99 mg/dL 213 (H) 086 (H) 578 (H) 291 (H) 250 (H)    Admit with: Acute Appendicitis  History: Type 1 DM since age 35  Home DM Meds: Lantus 13 units QHS       Novolog 8 units tidwc  Current Insulin Orders: IV Insulin drip     -Note patient for Appendectomy today.  -Patient did receive 13 units Lantus at 3am today.  -Started on IV insulin drip at 9am today.    MD- When patient is post-op, please consider the following:  1. Stop IV Insulin drip.   2. Patient will not need Lantus until 3am tomorrow AM (01/20) as he received 13 units Lantus at 3am this morning- To make timing acceptable for patient, could start Lantus 13 units daily at 6am tomorrow morning (01/20).  3. Start Novolog Sensitive SSI Q4 hours once Insulin drip stopped  4. Once patient starts to eat, will need to add Novolog Meal Coverage to in-patient insulin regimen      --Will follow patient during hospitalization--  Ambrose Finland RN, MSN, CDE Diabetes Coordinator Inpatient Glycemic Control Team Team Pager: [phone] (8a-5p)

## 2015-03-17 NOTE — Progress Notes (Signed)
Patient seen and examined  35 year old Hispanic male with a history of type 1 diabetes, nephropathy, retinopathy, hypertension. Also has history of a right lung stripping when he was a child for infection. He presented to the emergency department with a 24-hour history of fever nausea vomiting and abdominal pain.CT scan shows acute appendicitis but no evidence of perforation or abscess. Gallstones without evidence of inflammation also noted.  Plan #1 continue Zosyn for acute appendicitis, patient NPO for surgery, continue Zosyn #2 CBG uncontrolled, patient transition to glucose stabilizer #3 acute on chronic renal failure, in the setting of  appendicitis, dehydration, baseline creatinine 1.7, now 2.34, continue generous IV fluids

## 2015-03-17 NOTE — Anesthesia Procedure Notes (Signed)
Procedure Name: Intubation Date/Time: 03/17/2015 11:58 AM Performed by: Jarvis Newcomer A Pre-anesthesia Checklist: Patient identified, Timeout performed, Emergency Drugs available, Suction available and Patient being monitored Patient Re-evaluated:Patient Re-evaluated prior to inductionOxygen Delivery Method: Circle system utilized Preoxygenation: Pre-oxygenation with 100% oxygen Intubation Type: IV induction Ventilation: Mask ventilation without difficulty Laryngoscope Size: Mac and 3 Grade View: Grade I Tube type: Oral Tube size: 7.5 mm Number of attempts: 1 Airway Equipment and Method: Stylet Placement Confirmation: ETT inserted through vocal cords under direct vision,  breath sounds checked- equal and bilateral and positive ETCO2 Secured at: 22 cm Tube secured with: Tape Dental Injury: Teeth and Oropharynx as per pre-operative assessment  Comments: By Merrilyn Puma, CRNA

## 2015-03-17 NOTE — ED Notes (Signed)
Hospitalist at bedside 

## 2015-03-17 NOTE — Consult Note (Signed)
Reason for Consult:   Acute appendicitis  Referring Physician: Mitzi Hansen, MD [facility])   Jeffrey Hale is an 35 y.o. male.   HPI: This is a 35 year old Hispanic male with a history of type 1 diabetes, nephropathy, retinopathy, hypertension.  Also has history of a right lung stripping when he was a child for infection.  He presented to the emergency department with a 24-hour history of fever nausea vomiting and abdominal pain.  No prior history of similar symptoms.  He came more severe and he felt that he was having fevers.  Patient is constant.  Severe.  Localize the abdomen.  No diarrhea.  No chest pain or headache.  In the ED he was found to have low-grade fever to 38.5C.  Lab work showed leukocytosis with WBC 19,000, hemoglobin 10.2, normal lactic acid, normal protein tone and, potassium 4.9, creatinine 2.34, BUN 51.  Normal liver function tests.  Urinalysis showed high glucose but no signs of gross infection.  CT scan shows acute appendicitis but no evidence of perforation or abscess.  Gallstones without evidence of inflammation also noted.  He is apparently being admitted by the medicine department.  He has been started on Rocephin and Flagyl.  He is being prepared for surgery for appendectomy.  Past Medical History  Diagnosis Date  . Diabetes mellitus type I [facility])     onset age 62  . Hyperlipidemia   . CKD (chronic kidney disease) stage 3, GFR 30-59 ml/min 08/16/2014  . 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 Katy Fitch, MD 07/08/2014     . Essential hypertension, benign 08/16/2014    Past Surgical History  Procedure Laterality Date  . Lung surgery  1996    Right Lung     Family History  Problem Relation Age of Onset  . Diabetes Mother     DMII  . Hypertension Mother   . Diabetes Father     DMII  . Hypertension Father   . Hyperlipidemia Father   . Depression Father      Social History:  reports that he has never smoked. He does not have any smokeless tobacco history on file. He reports that he drinks alcohol. He reports that he does not use illicit drugs.  Allergies: No Known Allergies  Medications: I have reviewed the patient's current medications.  Results for orders placed or performed during the hospital encounter of 03/16/15 (from the past 48 hour(s))  Comprehensive metabolic panel     Status: Abnormal   Collection Time: 03/16/15  7:56 PM  Result Value Ref Range   Sodium 138 135 - 145 mmol/L   Potassium 5.2 (H) 3.5 - 5.1 mmol/L   Chloride 103 101 - 111 mmol/L   CO2 20 (L) 22 - 32 mmol/L   Glucose, Bld 325 (H) 65 - 99 mg/dL   BUN 44 (H) 6 - 20 mg/dL   Creatinine, Ser 2.08 (H) 0.61 - 1.24 mg/dL   Calcium 9.6 8.9 - 10.3 mg/dL   Total Protein 8.4 (H) 6.5 - 8.1 g/dL   Albumin 4.7 3.5 - 5.0 g/dL   AST 39 15 - 41 U/L   ALT 48 17 - 63 U/L   Alkaline Phosphatase 54 38 - 126 U/L   Total Bilirubin 1.4 (H) 0.3 - 1.2 mg/dL   GFR calc non Af Amer 40 (L) >60 mL/min   GFR calc Af Amer 47 (L) >60 mL/min  Comment: (NOTE) The eGFR has been calculated using the CKD EPI equation. This calculation has not been validated in all clinical situations. eGFR's persistently <60 mL/min signify possible Chronic Kidney Disease.    Anion gap 15 5 - 15  Urinalysis, Routine w reflex microscopic (not at [facility])     Status: Abnormal   Collection Time: 03/16/15  8:28 PM  Result Value Ref Range   Color, Urine YELLOW YELLOW   APPearance CLEAR CLEAR   Specific Gravity, Urine 1.019 1.005 - 1.030   pH 5.5 5.0 - 8.0   Glucose, UA >1000 (A) NEGATIVE mg/dL   Hgb urine dipstick TRACE (A) NEGATIVE   Bilirubin Urine NEGATIVE NEGATIVE   Ketones, ur 15 (A) NEGATIVE mg/dL   Protein, ur 30 (A) NEGATIVE mg/dL   Nitrite NEGATIVE NEGATIVE   Leukocytes, UA NEGATIVE NEGATIVE  Urine microscopic-add on     Status: Abnormal   Collection Time: 03/16/15  8:28 PM  Result Value Ref  Range   Squamous Epithelial / LPF NONE SEEN NONE SEEN   WBC, UA 0-5 0 - 5 WBC/hpf   RBC / HPF 0-5 0 - 5 RBC/hpf   Bacteria, UA NONE SEEN NONE SEEN   Casts HYALINE CASTS (A) NEGATIVE  Blood gas, venous     Status: Abnormal   Collection Time: 03/16/15 11:45 PM  Result Value Ref Range   FIO2 0.21    Delivery systems ROOM AIR    pH, Ven 7.298 7.250 - 7.300   pCO2, Ven 42.5 (L) 45.0 - 50.0 mmHg   pO2, Ven 62.7 (H) 30.0 - 45.0 mmHg   Bicarbonate 19.7 (L) 20.0 - 24.0 mEq/L   TCO2 18.6 0 - 100 mmol/L   Acid-base deficit 5.4 (H) 0.0 - 2.0 mmol/L   O2 Saturation 84.9 %   Patient temperature 101.3    Collection site VEIN    Drawn by COLLECTED BY LABORATORY    Sample type VEIN   CBG monitoring, ED     Status: Abnormal   Collection Time: 03/17/15 12:05 AM  Result Value Ref Range   Glucose-Capillary 410 (H) 65 - 99 mg/dL  Lactic acid, plasma     Status: None   Collection Time: 03/17/15  1:06 AM  Result Value Ref Range   Lactic Acid, Venous 1.7 0.5 - 2.0 mmol/L  Procalcitonin     Status: None   Collection Time: 03/17/15  1:06 AM  Result Value Ref Range   Procalcitonin 0.26 ng/mL    Comment:        Interpretation: PCT (Procalcitonin) <= 0.5 ng/mL: Systemic infection (sepsis) is not likely. Local bacterial infection is possible. (NOTE)         ICU PCT Algorithm               Non ICU PCT Algorithm    ----------------------------     ------------------------------         PCT < 0.25 ng/mL                 PCT < 0.1 ng/mL     Stopping of antibiotics            Stopping of antibiotics       strongly encouraged.               strongly encouraged.    ----------------------------     ------------------------------       PCT level decrease by  PCT < 0.25 ng/mL       >= 80% from peak PCT       OR PCT 0.25 - 0.5 ng/mL          Stopping of antibiotics                                             encouraged.     Stopping of antibiotics           encouraged.     ----------------------------     ------------------------------       PCT level decrease by              PCT >= 0.25 ng/mL       < 80% from peak PCT        AND PCT >= 0.5 ng/mL            Continuin g antibiotics                                              encouraged.       Continuing antibiotics            encouraged.    ----------------------------     ------------------------------     PCT level increase compared          PCT > 0.5 ng/mL         with peak PCT AND          PCT >= 0.5 ng/mL             Escalation of antibiotics                                          strongly encouraged.      Escalation of antibiotics        strongly encouraged.   Protime-INR     Status: None   Collection Time: 03/17/15  1:06 AM  Result Value Ref Range   Prothrombin Time 14.0 11.6 - 15.2 seconds   INR 1.06 0.00 - 1.49  APTT     Status: None   Collection Time: 03/17/15  1:06 AM  Result Value Ref Range   aPTT 27 24 - 37 seconds  CBG monitoring, ED     Status: Abnormal   Collection Time: 03/17/15  2:46 AM  Result Value Ref Range   Glucose-Capillary 304 (H) 65 - 99 mg/dL  Lactic acid, plasma     Status: None   Collection Time: 03/17/15  3:41 AM  Result Value Ref Range   Lactic Acid, Venous 1.1 0.5 - 2.0 mmol/L  Comprehensive metabolic panel     Status: Abnormal   Collection Time: 03/17/15  3:41 AM  Result Value Ref Range   Sodium 139 135 - 145 mmol/L   Potassium 4.9 3.5 - 5.1 mmol/L   Chloride 108 101 - 111 mmol/L   CO2 20 (L) 22 - 32 mmol/L   Glucose, Bld 346 (H) 65 - 99 mg/dL   BUN 51 (H) 6 - 20 mg/dL   Creatinine, Ser 2.34 (H) 0.61 - 1.24 mg/dL   Calcium 8.7 (L) 8.9 - 10.3 mg/dL  Total Protein 6.7 6.5 - 8.1 g/dL   Albumin 3.8 3.5 - 5.0 g/dL   AST 24 15 - 41 U/L   ALT 36 17 - 63 U/L   Alkaline Phosphatase 43 38 - 126 U/L   Total Bilirubin 1.1 0.3 - 1.2 mg/dL   GFR calc non Af Amer 35 (L) >60 mL/min   GFR calc Af Amer 40 (L) >60 mL/min    Comment: (NOTE) The eGFR has been  calculated using the CKD EPI equation. This calculation has not been validated in all clinical situations. eGFR's persistently <60 mL/min signify possible Chronic Kidney Disease.    Anion gap 11 5 - 15  CBC WITH DIFFERENTIAL     Status: Abnormal   Collection Time: 03/17/15  3:41 AM  Result Value Ref Range   WBC 19.0 (H) 4.0 - 10.5 K/uL   RBC 3.53 (L) 4.22 - 5.81 MIL/uL   Hemoglobin 10.2 (L) 13.0 - 17.0 g/dL   HCT 31.1 (L) 39.0 - 52.0 %   MCV 88.1 78.0 - 100.0 fL   MCH 28.9 26.0 - 34.0 pg   MCHC 32.8 30.0 - 36.0 g/dL   RDW 13.5 11.5 - 15.5 %   Platelets 337 150 - 400 K/uL   Neutrophils Relative % 89 %   Neutro Abs 17.0 (H) 1.7 - 7.7 K/uL   Lymphocytes Relative 6 %   Lymphs Abs 1.1 0.7 - 4.0 K/uL   Monocytes Relative 5 %   Monocytes Absolute 0.9 0.1 - 1.0 K/uL   Eosinophils Relative 0 %   Eosinophils Absolute 0.0 0.0 - 0.7 K/uL   Basophils Relative 0 %   Basophils Absolute 0.0 0.0 - 0.1 K/uL  CBC with Differential/Platelet     Status: Abnormal   Collection Time: 03/17/15  3:41 AM  Result Value Ref Range   WBC 18.5 (H) 4.0 - 10.5 K/uL   RBC 3.50 (L) 4.22 - 5.81 MIL/uL   Hemoglobin 10.3 (L) 13.0 - 17.0 g/dL   HCT 30.8 (L) 39.0 - 52.0 %   MCV 88.0 78.0 - 100.0 fL   MCH 29.4 26.0 - 34.0 pg   MCHC 33.4 30.0 - 36.0 g/dL   RDW 13.5 11.5 - 15.5 %   Platelets 333 150 - 400 K/uL   Neutrophils Relative % 88 %   Neutro Abs 16.3 (H) 1.7 - 7.7 K/uL   Lymphocytes Relative 6 %   Lymphs Abs 1.1 0.7 - 4.0 K/uL   Monocytes Relative 6 %   Monocytes Absolute 1.1 (H) 0.1 - 1.0 K/uL   Eosinophils Relative 0 %   Eosinophils Absolute 0.0 0.0 - 0.7 K/uL   Basophils Relative 0 %   Basophils Absolute 0.0 0.0 - 0.1 K/uL    Ct Abdomen Pelvis Wo Contrast  03/17/2015  CLINICAL DATA:  Central abdominal pain with nausea and vomiting, fever. History of hypertension, hyperlipidemia, diabetes and RIGHT lung surgery. EXAM: CT ABDOMEN AND PELVIS WITHOUT CONTRAST TECHNIQUE: Multidetector CT imaging of the  abdomen and pelvis was performed following the standard protocol without IV contrast. COMPARISON:  None. FINDINGS: LUNG BASES: Mild scarring in RIGHT lung base, with dependent atelectasis ; focal thickening of the medial RIGHT pleural reflection is likely postoperative. The visualized heart and pericardium are unremarkable. KIDNEYS/BLADDER: Kidneys are orthotopic, demonstrating normal size and morphology. No nephrolithiasis, hydronephrosis; limited assessment for renal masses on this nonenhanced examination. The unopacified ureters are normal in course and caliber. Urinary bladder is partially distended and unremarkable. SOLID ORGANS: Punctate layering gallstones.  No CT findings of acute cholecystitis. The liver, spleen, pancreas and adrenal glands are unremarkable for this non-contrast examination. GASTROINTESTINAL TRACT: The stomach, small and large bowel are normal in course and caliber without inflammatory changes, the sensitivity may be decreased by lack of enteric contrast. Enteric contrast has not yet reached the distal small bowel. The appendix is enlarged, 13 mm in transaxial dimension with periappendiceal inflammation, no focal fluid collection or appendicolith. PERITONEUM/RETROPERITONEUM: Aortoiliac vessels are normal in course and caliber. No lymphadenopathy by CT size criteria. Prostate size is normal. No intraperitoneal free fluid nor free air. SOFT TISSUES/ OSSEOUS STRUCTURES:  Nonsuspicious. IMPRESSION: Acute appendicitis without complication. Cholelithiasis without CT findings of acute cholecystitis. Acute findings discussed with and reconfirmed by Dr.Reeves on 03/17/2015 at 1:43 am. Electronically Signed   By: Elon Alas M.D.   On: 03/17/2015 01:43   Dg Chest 2 View  03/17/2015  CLINICAL DATA:  Acute onset of vomiting. Sepsis. Initial encounter. EXAM: CHEST  2 VIEW COMPARISON:  None. FINDINGS: The lungs are hypoexpanded. Mild vascular crowding is noted. There is no evidence of focal  opacification, pleural effusion or pneumothorax. The heart is borderline normal in size. No acute osseous abnormalities are seen. IMPRESSION: Lungs hypoexpanded but grossly clear. Electronically Signed   By: Garald Balding M.D.   On: 03/17/2015 02:24    Blood pressure 95/70, pulse 84, temperature 99.2 F (37.3 C), temperature source Oral, resp. rate 12, weight 72.6 kg (160 lb 0.9 oz), SpO2 96 %.   EXAM: General: Not in acute respiratory distress, patient states  pain is better at this hour after analgesics and antibiotics. HEENT:  Eyes: PERRL, EOMI, no scleral icterus or conjunctival pallor.   Neck: No JVD, no bruit, no appreciable mass Lymphatics No cervical adenopathy, no pallor Cardiac: Rate ~100 and regular , No murmurs, No gallops or rubs. Pulm: Good air movement bilaterally. No rales, wheezing, rhonchi or rubs. Abd: Soft, nondistended, periumbilical tenderness with voluntary guarding, no rebound pain, no mass or organomegaly, BS present.  No mass noted.  Not well localized.  No evidence of diffuse peritonitis. Ext: No LE edema bilaterally. 2+DP/PT pulse bilaterally. Musculoskeletal: No gross deformity, no red, hot, swollen joints, no limitation in ROM  Skin: No rashes or wounds on exposed surfaces  Neuro: Alert, oriented X3, cranial nerves II-XII grossly intact. No focal findings Psych: Patient is not overtly psychotic, appropriate mood and affect .  Assessment/Plan:  1.  sepsis d/t acute appendicitis        Although his physical exam does not show dramatic localized tenderness and guarding, his CT scan it is essentially diagnostic of acute appendicitis.  Considering his history and leukocytosis, I have advised proceeding to the operating room for appendectomy this morning.        I have discussed with the patient the indications, details, techniques, and numerous risk of the surgery.  He is aware of the risk of bleeding, infection, conversion to open laparotomy, wound  healing problems such as hernia, injury to adjacent organs with major reconstructive surgery, reoperation for complications, cardiac pulmonary and thromboembolic problem.  He knows that he is at increased risk because of his diabetes and renal insufficiency.  At this time all of his questions are answered.  He understands all these issues well.  He agrees with this plan.  2. AKI on CKD         Hydrate   3. Type 1 DM         Appreciate management by [facility]  hospitalist 4. Hypertension          Managed by [facility]  5. Hyperkalemia       Potassium 4.9 acceptable   for surgery Jaleal Schliep M 03/17/2015, 5:47 AM

## 2015-03-17 NOTE — Transfer of Care (Signed)
Immediate Anesthesia Transfer of Care Note  Patient: Jeffrey Hale  Procedure(s) Performed: Procedure(s): APPENDECTOMY LAPAROSCOPIC (N/A)  Patient Location: PACU  Anesthesia Type:General  Level of Consciousness: awake, alert , oriented and patient cooperative  Airway & Oxygen Therapy: Patient Spontanous Breathing and Patient connected to face mask oxygen  Post-op Assessment: Report given to RN, Post -op Vital signs reviewed and stable and Patient moving all extremities  Post vital signs: Reviewed and stable  Last Vitals:  Filed Vitals:   03/17/15 0930 03/17/15 1000  BP: 108/79 108/71  Pulse:  86  Temp:    Resp: 14 18    Complications: No apparent anesthesia complications

## 2015-03-17 NOTE — ED Notes (Signed)
Pt has 1 belongings bag sent with patient.  Locked items paper with patient

## 2015-03-18 DIAGNOSIS — Z794 Long term (current) use of insulin: Secondary | ICD-10-CM | POA: Diagnosis not present

## 2015-03-18 DIAGNOSIS — I10 Essential (primary) hypertension: Secondary | ICD-10-CM

## 2015-03-18 DIAGNOSIS — N179 Acute kidney failure, unspecified: Secondary | ICD-10-CM

## 2015-03-18 DIAGNOSIS — K529 Noninfective gastroenteritis and colitis, unspecified: Secondary | ICD-10-CM | POA: Diagnosis present

## 2015-03-18 DIAGNOSIS — E875 Hyperkalemia: Secondary | ICD-10-CM | POA: Diagnosis present

## 2015-03-18 DIAGNOSIS — E1065 Type 1 diabetes mellitus with hyperglycemia: Secondary | ICD-10-CM

## 2015-03-18 DIAGNOSIS — E1021 Type 1 diabetes mellitus with diabetic nephropathy: Secondary | ICD-10-CM

## 2015-03-18 DIAGNOSIS — R6520 Severe sepsis without septic shock: Secondary | ICD-10-CM | POA: Diagnosis present

## 2015-03-18 DIAGNOSIS — E10319 Type 1 diabetes mellitus with unspecified diabetic retinopathy without macular edema: Secondary | ICD-10-CM | POA: Diagnosis present

## 2015-03-18 DIAGNOSIS — E103299 Type 1 diabetes mellitus with mild nonproliferative diabetic retinopathy without macular edema, unspecified eye: Secondary | ICD-10-CM | POA: Diagnosis not present

## 2015-03-18 DIAGNOSIS — E860 Dehydration: Secondary | ICD-10-CM | POA: Diagnosis present

## 2015-03-18 DIAGNOSIS — A09 Infectious gastroenteritis and colitis, unspecified: Secondary | ICD-10-CM | POA: Diagnosis not present

## 2015-03-18 DIAGNOSIS — K3580 Unspecified acute appendicitis: Secondary | ICD-10-CM

## 2015-03-18 DIAGNOSIS — A419 Sepsis, unspecified organism: Secondary | ICD-10-CM | POA: Diagnosis present

## 2015-03-18 DIAGNOSIS — N183 Chronic kidney disease, stage 3 (moderate): Secondary | ICD-10-CM | POA: Diagnosis present

## 2015-03-18 DIAGNOSIS — I129 Hypertensive chronic kidney disease with stage 1 through stage 4 chronic kidney disease, or unspecified chronic kidney disease: Secondary | ICD-10-CM | POA: Diagnosis present

## 2015-03-18 DIAGNOSIS — E1022 Type 1 diabetes mellitus with diabetic chronic kidney disease: Secondary | ICD-10-CM | POA: Diagnosis present

## 2015-03-18 DIAGNOSIS — R1084 Generalized abdominal pain: Secondary | ICD-10-CM | POA: Diagnosis present

## 2015-03-18 LAB — COMPREHENSIVE METABOLIC PANEL
ALT: 32 U/L (ref 17–63)
ANION GAP: 8 (ref 5–15)
AST: 27 U/L (ref 15–41)
Albumin: 3.3 g/dL — ABNORMAL LOW (ref 3.5–5.0)
Alkaline Phosphatase: 38 U/L (ref 38–126)
BUN: 29 mg/dL — ABNORMAL HIGH (ref 6–20)
CHLORIDE: 106 mmol/L (ref 101–111)
CO2: 25 mmol/L (ref 22–32)
CREATININE: 1.85 mg/dL — AB (ref 0.61–1.24)
Calcium: 8.6 mg/dL — ABNORMAL LOW (ref 8.9–10.3)
GFR, EST AFRICAN AMERICAN: 54 mL/min — AB (ref >60)
GFR, EST NON AFRICAN AMERICAN: 46 mL/min — AB (ref >60)
Glucose, Bld: 209 mg/dL — ABNORMAL HIGH (ref 65–99)
POTASSIUM: 4.2 mmol/L (ref 3.5–5.1)
SODIUM: 139 mmol/L (ref 135–145)
Total Bilirubin: 1 mg/dL (ref 0.3–1.2)
Total Protein: 6.1 g/dL — ABNORMAL LOW (ref 6.5–8.1)

## 2015-03-18 LAB — CBC
HCT: 31.3 % — ABNORMAL LOW (ref 39.0–52.0)
HEMOGLOBIN: 10 g/dL — AB (ref 13.0–17.0)
MCH: 28.7 pg (ref 26.0–34.0)
MCHC: 31.9 g/dL (ref 30.0–36.0)
MCV: 89.7 fL (ref 78.0–100.0)
PLATELETS: 330 10*3/uL (ref 150–400)
RBC: 3.49 MIL/uL — ABNORMAL LOW (ref 4.22–5.81)
RDW: 13.8 % (ref 11.5–15.5)
WBC: 14.2 10*3/uL — ABNORMAL HIGH (ref 4.0–10.5)

## 2015-03-18 LAB — URINE CULTURE

## 2015-03-18 LAB — HEMOGLOBIN A1C
Hgb A1c MFr Bld: 9.8 % — ABNORMAL HIGH (ref 4.8–5.6)
Mean Plasma Glucose: 235 mg/dL

## 2015-03-18 LAB — GLUCOSE, CAPILLARY
GLUCOSE-CAPILLARY: 207 mg/dL — AB (ref 65–99)
GLUCOSE-CAPILLARY: 234 mg/dL — AB (ref 65–99)

## 2015-03-18 MED ORDER — HYDROCODONE-ACETAMINOPHEN 5-325 MG PO TABS: 1.0000 | ORAL_TABLET | ORAL | Status: DC | PRN

## 2015-03-18 MED ORDER — AMLODIPINE BESYLATE 10 MG PO TABS: 10.0000 mg | ORAL_TABLET | Freq: Every day | ORAL | Status: DC

## 2015-03-18 NOTE — Anesthesia Postprocedure Evaluation (Signed)
Anesthesia Post Note  Patient: Jeffrey Hale  Procedure(s) Performed: Procedure(s) (LRB): APPENDECTOMY LAPAROSCOPIC (N/A)  Patient location during evaluation: PACU Anesthesia Type: General Level of consciousness: awake and alert Pain management: pain level controlled Vital Signs Assessment: post-procedure vital signs reviewed and stable Respiratory status: spontaneous breathing, nonlabored ventilation, respiratory function stable and patient connected to nasal cannula oxygen Cardiovascular status: blood pressure returned to baseline and stable Postop Assessment: no signs of nausea or vomiting Anesthetic complications: no    Last Vitals:  Filed Vitals:   03/17/15 2054 03/18/15 0100  BP: 121/74 98/62  Pulse: 85 81  Temp: 36.6 C 36.7 C  Resp: 18 18    Last Pain:  Filed Vitals:   03/18/15 0121  PainSc: 2                  Leahann Lempke J

## 2015-03-18 NOTE — Progress Notes (Signed)
Discharge instructions and prescription given to patient.  Questions answered 

## 2015-03-18 NOTE — Progress Notes (Signed)
   Stable for DC From a surgical standpoint.  I have scheduled his follow up.   Instructions provided. Please call surgery with further assistance.  Ilyaas Musto, ANP-BC

## 2015-03-18 NOTE — Discharge Summary (Addendum)
Physician Discharge Summary  Jeffrey Hale ZOX:[id number] DOB: 09-18-1980 DOA: 03/16/2015  PCP: Nilda Simmer, MD  Admit date: 03/16/2015 Discharge date: 03/18/2015  Time spent: 20 minutes  Recommendations for Outpatient Follow-up:  1. Follow up with PCP in 2-3 weeks 2. Follow up with General Surgery as scheduled 3. Follow up with Endocrinologist as scheduled  4. 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 Briefly, 35 y.o. male with PMH of type 1 diabetes mellitus with nephropathy and retinopathy, and hypertension who presents to the ED with fever, nausea, vomiting, and abdominal pain. Patient was found to be febrile to 38.5 C, saturating well on room air and with vital signs stable. Initial blood work is notable for acute renal insufficiency, mild hyperkalemia, and mild acidosis. UA featured >1k glucose and 15 ketones, but there was no suggestion of infection. The patient was bolused with 30 cc/kg NS in the ED, Dilaudid and Zofran were administered, and an IV push of Protonix given.  Hospital Course:   1. Severe sepsis d/t acute appendicitis  - CT findings on admit consistent with acute appendicitis without complication - 30 cc/kg NS bolus given in ED  - Blood cultures were obtained, remain pending at time of discharge - On  admit, was started on empiric Zosyn. No further abx on discharge per Surgery - General surgery was consulted. Patient underwent lap appendectomy on 1/19 - Patient remained stable post-operatively for close outpatient follow up   2. AKI on CKD , unknown if ATN - SCr 2.08 on admission, up from apparent baseline of 1.8  - CKD secondary to diabetic nephropathy   - Received sepsis fluids in ED, continuing with NS at 150 cc/hr  - Renal function improved to near baseline with hydration  3. Type 1 DM  - Follows with Dr. Everardo All outpt  - Patient   4. Hypertension - Held pt's lisinopril and HCTZ in setting of AKI with electrolyte derangements  - Continued Norvasc, increased to 10 mg for now with very good bp control   5. Hyperkalemia  - Secondary to AKI and hyperglycemia  - resolved  Procedures:  Laparoscopic appendectomy 1/19  Consultations:  General Surgery  Diabetic Coordinaator  Discharge Exam: Filed Vitals:   03/17/15 2054 03/18/15 0100 03/18/15 0504 03/18/15 1000  BP: 121/74 98/62 117/73 125/75  Pulse: 85 81 82 85  Temp: 97.9 F (36.6 C) 98.1 F (36.7 C) 98.2 F (36.8 C) 98.1 F (36.7 C)  TempSrc: Oral Oral Oral Oral  Resp: Weight:      SpO2: 100% 97% 99% 99%    General: awake, in nad Cardiovascular: regular, s1, s2 Respiratory: normal resp effort, no wheezing  Discharge Instructions     Medication List    STOP taking these medications        hydrochlorothiazide 12.5 MG tablet  Commonly  known as:  HYDRODIURIL     lisinopril 10 MG tablet  Commonly known as:  PRINIVIL,ZESTRIL      TAKE these medications        amLODipine 10 MG tablet  Commonly known as:  NORVASC  Take 1 tablet (10 mg total) by mouth daily.     glucagon 1 MG injection  Commonly known as:  GLUCAGON EMERGENCY  Inject 1 mg into the muscle once as needed.     HYDROcodone-acetaminophen 5-325 MG tablet  Commonly known as:  NORCO/VICODIN  Take 1-2 tablets by  mouth every 4 (four) hours as needed for moderate pain.     insulin aspart 100 UNIT/ML FlexPen  Commonly known as:  NOVOLOG FLEXPEN  Inject 8 Units into the skin 3 (three) times daily with meals. And pen needles 4/day     Insulin Glargine 100 UNIT/ML Solostar Pen  Commonly known as:  LANTUS SOLOSTAR  Inject 13 units at bed time.     simvastatin 5 MG tablet  Commonly known as:  ZOCOR  TAKE 1 TABLET(5 MG) BY MOUTH DAILY       No Known Allergies Follow-up Information    Follow up with [facility] On 04/05/2015.   Specialty:  General Surgery   Why:  arrive by 2:30PM for a 3PM post op check   Contact information:   [address] [place] [address] [phone]       Follow up with SMITH,KRISTI, MD. Schedule an appointment as soon as possible for a visit in 2 weeks.   Specialty:  Family Medicine   Contact information:   [address] [place] [address] [phone]       Follow up with Follow up with your endocrinologist.   Why:  Hospital follow up       The results of significant diagnostics from this hospitalization (including imaging, microbiology, ancillary and laboratory) are listed below for reference.    Significant Diagnostic Studies: Ct Abdomen Pelvis Wo Contrast  03/17/2015  CLINICAL DATA:  Central abdominal pain with nausea and vomiting, fever. History of hypertension, hyperlipidemia, diabetes and RIGHT lung surgery. EXAM: CT ABDOMEN AND PELVIS WITHOUT CONTRAST TECHNIQUE: Multidetector CT imaging of the abdomen and pelvis was performed following the standard protocol without IV contrast. COMPARISON:  None. FINDINGS: LUNG BASES: Mild scarring in RIGHT lung base, with dependent atelectasis ; focal thickening of the medial RIGHT pleural reflection is likely postoperative. The visualized heart and pericardium are unremarkable. KIDNEYS/BLADDER: Kidneys are orthotopic, demonstrating normal size and morphology. No nephrolithiasis, hydronephrosis;  limited assessment for renal masses on this nonenhanced examination. The unopacified ureters are normal in course and caliber. Urinary bladder is partially distended and unremarkable. SOLID ORGANS: Punctate layering gallstones. No CT findings of acute cholecystitis. The liver, spleen, pancreas and adrenal glands are unremarkable for this non-contrast examination. GASTROINTESTINAL TRACT: The stomach, small and large bowel are normal in course and caliber without inflammatory changes, the sensitivity may be decreased by lack of enteric contrast. Enteric contrast has not yet reached the distal small bowel. The appendix is enlarged, 13 mm in transaxial dimension with periappendiceal inflammation, no focal fluid collection or appendicolith. PERITONEUM/RETROPERITONEUM: Aortoiliac vessels are normal in course and caliber. No lymphadenopathy by CT size criteria. Prostate size is normal. No intraperitoneal free fluid nor free air. SOFT TISSUES/ OSSEOUS STRUCTURES:  Nonsuspicious. IMPRESSION: Acute appendicitis without complication. Cholelithiasis without CT findings of acute cholecystitis. Acute findings discussed with and reconfirmed by Dr.Reeves on 03/17/2015 at 1:43 am.  Electronically Signed   By: Awilda Metro M.D.   On: 03/17/2015 01:43   Dg Chest 2 View  03/17/2015  CLINICAL DATA:  Acute onset of vomiting. Sepsis. Initial encounter. EXAM: CHEST  2 VIEW COMPARISON:  None. FINDINGS: The lungs are hypoexpanded. Mild vascular crowding is noted. There is no evidence of focal opacification, pleural effusion or pneumothorax. The heart is borderline normal in size. No acute osseous abnormalities are seen. IMPRESSION: Lungs hypoexpanded but grossly clear. Electronically Signed   By: Roanna Raider M.D.   On: 03/17/2015 02:24    Microbiology: Recent Results (from the past 240 hour(s))  Urine culture     Status: None   Collection Time: 03/16/15  8:28 PM  Result Value Ref Range Status   Specimen Description URINE,  RANDOM  Final   Special Requests NONE  Final   Culture   Final    5,000 COLONIES/mL INSIGNIFICANT GROWTH Performed at [facility]    Report Status 03/18/2015 FINAL  Final     Labs: Basic Metabolic Panel:  Recent Labs Lab 03/16/15 1956 03/17/15 0341 03/18/15 0512  NA 138 139 139  K 5.2* 4.9 4.2  CL 103 108 106  CO2 20* 20* 25  GLUCOSE 325* 346* 209*  BUN 44* 51* 29*  CREATININE 2.08* 2.34* 1.85*  CALCIUM 9.6 8.7* 8.6*   Liver Function Tests:  Recent Labs Lab 03/16/15 1956 03/17/15 0341 03/18/15 0512  AST 39 24 27  ALT 48 36 32  ALKPHOS 54 43 38  BILITOT 1.4* 1.1 1.0  PROT 8.4* 6.7 6.1*  ALBUMIN 4.7 3.8 3.3*   No results for input(s): LIPASE, AMYLASE in the last 168 hours. No results for input(s): AMMONIA in the last 168 hours. CBC:  Recent Labs Lab 03/17/15 0341 03/18/15 0512  WBC 19.0*  18.5* 14.2*  NEUTROABS 17.0*  16.3*  --   HGB 10.2*  10.3* 10.0*  HCT 31.1*  30.8* 31.3*  MCV 88.1  88.0 89.7  PLT 337  333 330   Cardiac Enzymes: No results for input(s): CKTOTAL, CKMB, CKMBINDEX, TROPONINI in the last 168 hours. BNP: BNP (last 3 results) No results for input(s): BNP in the last 8760 hours.  ProBNP (last 3 results) No results for input(s): PROBNP in the last 8760 hours.  CBG:  Recent Labs Lab 03/17/15 1545 03/17/15 1717 03/17/15 2053 03/18/15 0749 03/18/15 1204  GLUCAP 132* 144* 154* 207* 234*     Signed:  Jaylenne Hamelin K  [facility] 03/18/2015, 12:17 PM

## 2015-03-18 NOTE — Discharge Instructions (Signed)
20lb weight restriction x2 weeks.  Then resume activity as tolerated.   LAPAROSCOPIC SURGERY: POST OP INSTRUCTIONS  1. DIET: Follow a light bland diet the first 24 hours after arrival home, such as soup, liquids, crackers, etc.  Be sure to include lots of fluids daily.  Avoid fast food or heavy meals as your are more likely to get nauseated.  Eat a low fat the next few days after surgery.   2. Take your usually prescribed home medications unless otherwise directed. 3. PAIN CONTROL: a. Pain is best controlled by a usual combination of three different methods TOGETHER: i. Ice/Heat ii. Over the counter pain medication iii. Prescription pain medication b. Most patients will experience some swelling and bruising around the incisions.  Ice packs or heating pads (30-60 minutes up to 6 times a day) will help. Use ice for the first few days to help decrease swelling and bruising, then switch to heat to help relax tight/sore spots and speed recovery.  Some people prefer to use ice alone, heat alone, alternating between ice & heat.  Experiment to what works for you.  Swelling and bruising can take several weeks to resolve.   c. It is helpful to take an over-the-counter pain medication regularly for the first few weeks.  Choose one of the following that works best for you: i. Naproxen (Aleve, etc)  Two  tabs twice a day ii. Ibuprofen (Advil, etc) Three  tabs four times a day (every meal & bedtime) iii. Acetaminophen (Tylenol, etc) 500-650mg  four times a day (every meal & bedtime) d. A  prescription for pain medication (such as oxycodone, hydrocodone, etc) should be given to you upon discharge.  Take your pain medication as prescribed.  i. If you are having problems/concerns with the prescription medicine (does not control pain, nausea, vomiting, rash, itching, etc), please call us [phone] to see if we need to switch you to a different pain medicine that will work better for you and/or control  your side effect better. ii. If you need a refill on your pain medication, please contact your pharmacy.  They will contact our office to request authorization. Prescriptions will not be filled after 5 pm or on week-ends. 4. Avoid getting constipated.  Between the surgery and the pain medications, it is common to experience some constipation.  Increasing fluid intake and taking a fiber supplement (such as Metamucil, Citrucel, FiberCon, MiraLax, etc) 1-2 times a day regularly will usually help prevent this problem from occurring.  A mild laxative (prune juice, Milk of Magnesia, MiraLax, etc) should be taken according to package directions if there are no bowel movements after 48 hours.   5. Watch out for diarrhea.  If you have many loose bowel movements, simplify your diet to bland foods & liquids for a few days.  Stop any stool softeners and decrease your fiber supplement.  Switching to mild anti-diarrheal medications (Kayopectate, Pepto Bismol) can help.  If this worsens or does not improve, please call us. 6. Wash / shower every day.  You may shower over the dressings as they are waterproof.  Continue to shower over incision(s) after the dressing is off. 7. Remove your waterproof bandages 5 days after surgery.  You may leave the incision open to air.  You may replace a dressing/Band-Aid to cover the incision for comfort if you wish.  8. ACTIVITIES as tolerated:   a. You may resume regular (light) daily activities beginning the next day--such as daily self-care, walking, climbing stairs--gradually increasing  activities as tolerated.  If you can walk 30 minutes without difficulty, it is safe to try more intense activity such as jogging, treadmill, bicycling, low-impact aerobics, swimming, etc. b. Save the most intensive and strenuous activity for last such as sit-ups, heavy lifting, contact sports, etc  Refrain from any heavy lifting or straining until you are off narcotics for pain control.   c. DO NOT  PUSH THROUGH PAIN.  Let pain be your guide: If it hurts to do something, don't do it.  Pain is your body warning you to avoid that activity for another week until the pain goes down. d. You may drive when you are no longer taking prescription pain medication, you can comfortably wear a seatbelt, and you can safely maneuver your car and apply brakes. e. Bonita Quin may have sexual intercourse when it is comfortable.  9. FOLLOW UP in our office a. Please call [facility] at [phone] to set up an appointment to see your surgeon in the office for a follow-up appointment approximately 2-3 weeks after your surgery. b. Make sure that you call for this appointment the day you arrive home to insure a convenient appointment time. 10. IF YOU HAVE DISABILITY OR FAMILY LEAVE FORMS, BRING THEM TO THE OFFICE FOR PROCESSING.  DO NOT GIVE THEM TO YOUR DOCTOR.   WHEN TO CALL us [phone]: 1. Poor pain control 2. Reactions / problems with new medications (rash/itching, nausea, etc)  3. Fever over 101.5 F (38.5 C) 4. Inability to urinate 5. Nausea and/or vomiting 6. Worsening swelling or bruising 7. Continued bleeding from incision. 8. Increased pain, redness, or drainage from the incision   The clinic staff is available to answer your questions during regular business hours (8:30am-5pm).  Please dont hesitate to call and ask to speak to one of our nurses for clinical concerns.   If you have a medical emergency, go to the nearest emergency room or call 911.  A surgeon from [facility] is always on call at the [facility], [place] [address], [address], [place], [place]  [address] ? MAIN: [phone] ? TOLL FREE: [phone] ?  FAX [phone] [email or web address]

## 2015-03-18 NOTE — Progress Notes (Addendum)
Inpatient Diabetes Program Recommendations  AACE/ADA: New Consensus Statement on Inpatient Glycemic Control (2015)  Target Ranges:  Prepandial:   less than 140 mg/dL      Peak postprandial:   less than 180 mg/dL (1-2 hours)      Critically ill patients:  140 - 180 mg/dL    Results for Jeffrey Hale, Jeffrey Hale (MRN [id number]) as of 03/18/2015 09:02  Ref. Range 03/17/2015 14:32 03/17/2015 15:45 03/17/2015 17:17 03/17/2015 20:53 03/18/2015 07:49  Glucose-Capillary Latest Ref Range: 65-99 mg/dL 409 (H) 811 (H) 914 (H) 154 (H) 207 (H)    Admit with: Acute Appendicitis  History: Type 1 DM since age 30  Home DM Meds: Lantus 20 units QHS  Humalog 8 units tidwc  Current Insulin Orders: Lantus 25 units daily      Novolog Sensitive SSI (0-9 units) TID AC      MD- Note patient received 25 units Lantus last night at 9:30pm.  Scheduled to get another 25 units at 10am today.    Called RN caring for patient today and asked her to please contact Medicine physician who is managing DM here in hospital to ask about this AM dose of Lantus.    Likely should wait and give Lantus later today (bedtime) since patient already received 25 units Lantus last night and per record, patient only takes 20 units Lantus QHS at home.    Addendum 1145: Spoke with patient this AM and verified home insulin doses.  Takes Lantus 20 units QHS + Humalog 8 units tidwc (flexes Humalog doses up and down based on what he eats at meals).  Discussed current A1c of 9.8% with patient.  Reminded patient his goal A1c is 7% or less per ADA Standards.  Pt stated he needs to schedule follow-up appointment with his Endocrinologist Dr. Romero Belling since he missed his last appointment.  Patient stated he had no other questions at this time.     --Will follow patient during hospitalization--  Ambrose Finland RN, MSN, CDE Diabetes Coordinator Inpatient Glycemic Control Team Team Pager: [phone]  (8a-5p)

## 2015-03-18 NOTE — Progress Notes (Signed)
1 Day Post-Op  Subjective: Stable and alert.  Tolerating liquid diet.  No nausea.  Not very hungry Able to void.  Slight dysuria from Foley Pain is better than preop I explained operative findings to him  CBGs well-controlled.  Range 125 03/29/1951 last 18 hours. Hemoglobin 10.0.  W BC 14,200  Objective: Vital signs in last 24 hours: Temp:  [97.9 F (36.6 C)-98.6 F (37 C)] 98.2 F (36.8 C) (01/20 0504) Pulse Rate:  [76-109] 82 (01/20 0504) Resp:  [13-28] 18 (01/20 0504) BP: (92-129)/(49-87) 117/73 mmHg (01/20 0504) SpO2:  [91 %-100 %] 99 % (01/20 0504)    Intake/Output from previous day: 01/19 0701 - 01/20 0700 In: 3645 [I.V.:3145; IV Piggyback:500] Out: 1975 [Urine:1950; Blood:25] Intake/Output this shift: Total I/O In: 1300 [I.V.:800; IV Piggyback:500] Out: 1650 [Urine:1650]  General appearance: Alert and cooperative.  Minimal distress. Resp: clear to auscultation bilaterally GI: Soft.  Wounds look good.  Appropriate incisional tenderness.  Otherwise soft and benign.  Lab Results:  Results for orders placed or performed during the hospital encounter of 03/16/15 (from the past 24 hour(s))  CBG monitoring, ED     Status: Abnormal   Collection Time: 03/17/15  7:10 AM  Result Value Ref Range   Glucose-Capillary 297 (H) 65 - 99 mg/dL   Comment 1 Notify RN    Comment 2 Document in Chart   CBG monitoring, ED     Status: Abnormal   Collection Time: 03/17/15  8:57 AM  Result Value Ref Range   Glucose-Capillary 291 (H) 65 - 99 mg/dL  CBG monitoring, ED     Status: Abnormal   Collection Time: 03/17/15 10:29 AM  Result Value Ref Range   Glucose-Capillary 250 (H) 65 - 99 mg/dL  Glucose, capillary     Status: Abnormal   Collection Time: 03/17/15 11:30 AM  Result Value Ref Range   Glucose-Capillary 168 (H) 65 - 99 mg/dL  Glucose, capillary     Status: None   Collection Time: 03/17/15 12:27 PM  Result Value Ref Range   Glucose-Capillary 97 65 - 99 mg/dL   Comment 1 Call  MD NNP PA CNM   Glucose, capillary     Status: None   Collection Time: 03/17/15  1:24 PM  Result Value Ref Range   Glucose-Capillary 78 65 - 99 mg/dL  Glucose, capillary     Status: Abnormal   Collection Time: 03/17/15  2:32 PM  Result Value Ref Range   Glucose-Capillary 125 (H) 65 - 99 mg/dL   Comment 1 Notify RN    Comment 2 Document in Chart   Glucose, capillary     Status: Abnormal   Collection Time: 03/17/15  3:45 PM  Result Value Ref Range   Glucose-Capillary 132 (H) 65 - 99 mg/dL   Comment 1 Notify RN    Comment 2 Document in Chart   Glucose, capillary     Status: Abnormal   Collection Time: 03/17/15  5:17 PM  Result Value Ref Range   Glucose-Capillary 144 (H) 65 - 99 mg/dL  Glucose, capillary     Status: Abnormal   Collection Time: 03/17/15  8:53 PM  Result Value Ref Range   Glucose-Capillary 154 (H) 65 - 99 mg/dL  CBC     Status: Abnormal   Collection Time: 03/18/15  5:12 AM  Result Value Ref Range   WBC 14.2 (H) 4.0 - 10.5 K/uL   RBC 3.49 (L) 4.22 - 5.81 MIL/uL   Hemoglobin 10.0 (L) 13.0 - 17.0  g/dL   HCT 16.1 (L) 09.6 - 04.5 %   MCV 89.7 78.0 - 100.0 fL   MCH 28.7 26.0 - 34.0 pg   MCHC 31.9 30.0 - 36.0 g/dL   RDW 40.9 81.1 - 91.4 %   Platelets 330 150 - 400 K/uL     Studies/Results: No results found.  Marland Kitchen amLODipine  10 mg Oral Daily  . heparin  5,000 Units Subcutaneous 3 times per day  . insulin aspart  0-9 Units Subcutaneous TID WC  . insulin glargine  25 Units Subcutaneous Daily  . insulin regular  0-10 Units Intravenous TID WC  . lip balm  1 application Topical BID  . pantoprazole (PROTONIX) IV  40 mg Intravenous QHS  . piperacillin-tazobactam (ZOSYN)  IV  3.375 g Intravenous Q8H  . simvastatin  10 mg Oral QHS  . sodium chloride  3 mL Intravenous Q12H     Assessment/Plan: s/p Procedure(s): APPENDECTOMY LAPAROSCOPIC  POD #1.  Laparoscopic appendectomy for uncomplicated acute appendicitis Discontinue IV antibiotics Advance diet and  activities Hopefully home in 12th 24 hours if stable medically  Type 1 diabetes mellitus. Excellent blood sugar control.  Managed by [facility]  AKI superimposed on CKD. Excellent urine output Bmet pending  Hypertension. Being managed by [facility].  Excellent control   @  LOS: 2 days    Khloie Hamada M 03/18/2015  . .prob

## 2015-03-22 LAB — CULTURE, BLOOD (ROUTINE X 2)
Culture: NO GROWTH
Culture: NO GROWTH

## 2015-04-04 ENCOUNTER — Encounter: Payer: Self-pay | Admitting: Endocrinology

## 2015-04-04 ENCOUNTER — Ambulatory Visit (INDEPENDENT_AMBULATORY_CARE_PROVIDER_SITE_OTHER): Payer: Managed Care, Other (non HMO) | Admitting: Endocrinology

## 2015-04-04 VITALS — BP 122/70 | HR 116 | Temp 98.5°F | Ht 64.5 in | Wt 158.0 lb

## 2015-04-04 DIAGNOSIS — E109 Type 1 diabetes mellitus without complications: Secondary | ICD-10-CM | POA: Diagnosis not present

## 2015-04-04 DIAGNOSIS — N183 Chronic kidney disease, stage 3 unspecified: Secondary | ICD-10-CM

## 2015-04-04 DIAGNOSIS — E1021 Type 1 diabetes mellitus with diabetic nephropathy: Secondary | ICD-10-CM

## 2015-04-04 LAB — BASIC METABOLIC PANEL
BUN: 47 mg/dL — ABNORMAL HIGH (ref 6–23)
CO2: 26 mEq/L (ref 19–32)
Calcium: 9.9 mg/dL (ref 8.4–10.5)
Chloride: 105 mEq/L (ref 96–112)
Creatinine, Ser: 2.22 mg/dL — ABNORMAL HIGH (ref 0.40–1.50)
GFR: 36.25 mL/min — AB (ref >60.00)
GLUCOSE: 65 mg/dL — AB (ref 70–99)
POTASSIUM: 4.5 meq/L (ref 3.5–5.1)
SODIUM: 139 meq/L (ref 135–145)

## 2015-04-04 LAB — TSH: TSH: 0.54 u[IU]/mL (ref 0.35–4.50)

## 2015-04-04 MED ORDER — INSULIN GLARGINE 100 UNIT/ML SOLOSTAR PEN: 12.0000 [IU] | PEN_INJECTOR | Freq: Every day | SUBCUTANEOUS | Status: DC

## 2015-04-04 MED ORDER — INSULIN PEN NEEDLE 33G X 6 MM MISC: 1.0000 | Freq: Four times a day (QID) | Status: AC

## 2015-04-04 NOTE — Progress Notes (Signed)
Subjective:    Patient ID: Jeffrey Hale, male    DOB: 06/25/1980, 35 y.o.   MRN: [id number]  HPI Pt returns for f/u of diabetes mellitus: DM type: 1 Dx'ed: 1990 Complications: none Therapy: insulin since dx DKA: never Severe hypoglycemia: once (2013) Pancreatitis: never Other: he takes multiple daily injections.   Interval history: pt says he takes insulin as rx'ed.  He is still pursuing an insulin pump. no cbg record, but states cbg's are well-controlled.  There is no trend throughout the day.  Last week, he had another episode of severe hypoglycemia.  This happend at work, at 1:20 pm. Lunch had been delayed.  He says in general, he says cbg's are lower when a meal is delayed.  He says he never misses the insulin.  Past Medical History  Diagnosis Date  . Diabetes mellitus type I [facility])     onset age 23  . Hyperlipidemia   . CKD (chronic kidney disease) stage 3, GFR 30-59 ml/min 08/16/2014  . Background diabetic retinopathy without macular edema associated with type 1 diabetes mellitus 07/08/2014    [facility]. Dione Booze, MD 07/08/2014     . Essential hypertension, benign 08/16/2014    Past Surgical History  Procedure Laterality Date  . Lung surgery  1996    Right Lung   . Laparoscopic appendectomy N/A 03/17/2015    Procedure: APPENDECTOMY LAPAROSCOPIC;  Surgeon: Claud Kelp, MD;  Location: [facility];  Service: General;  Laterality: N/A;    Social History   Social History  . Marital Status: Single    Spouse Name: N/A  . Number of Children: N/A  . Years of Education: N/A   Occupational History  . Not on file.   Social History Main Topics  . Smoking status: Never Smoker   . Smokeless tobacco: Not on file  . Alcohol Use: 0.0 oz/week    0 Standard drinks or equivalent per week     Comment: Beer - ocassionally   . Drug Use: No  . Sexual Activity: Yes   Other Topics Concern  . Not on file   Social History Narrative   Marital status: single;  dating seriously x 5 years; [place]; moved to [place] in 08/2013.      Children: 2 children (64 yo daughter, 1 yo son)      Lives: alone; children with mothers in [place]      Employment:  Works for [employer]; Furniture conservator/restorer.      Tobacco: none      Alcohol: 4 drinks per week; no DUIs.      Drugs: none      Exercise:  Sporadic    Current Outpatient Prescriptions on File Prior to Visit  Medication Sig Dispense Refill  . amLODipine (NORVASC) 10 MG tablet Take 1 tablet (10 mg total) by mouth daily. 30 tablet 0  . glucagon (GLUCAGON EMERGENCY) 1 MG injection Inject 1 mg into the muscle once as needed. 1 each 12  . insulin aspart (NOVOLOG FLEXPEN) 100 UNIT/ML FlexPen Inject 8 Units into the skin 3 (three) times daily with meals. And pen needles 4/day 15 mL 11  . simvastatin (ZOCOR) 5 MG tablet TAKE 1 TABLET(5 MG) BY MOUTH DAILY 90 tablet 0   No current facility-administered medications on file prior to visit.    No Known Allergies  Family History  Problem Relation Age of Onset  . Diabetes Mother     DMII  . Hypertension Mother   .  Diabetes Father     DMII  . Hypertension Father   . Hyperlipidemia Father   . Depression Father     BP 122/70 mmHg  Pulse 116  Temp(Src) 98.5 F (36.9 C) (Oral)  Ht 5' 4.5" (1.638 m)  Wt 158 lb (71.668 kg)  BMI 26.71 kg/m2  SpO2 94%    Review of Systems Denies weight change    Objective:   Physical Exam VITAL SIGNS:  See vs page GENERAL: no distress Pulses: dorsalis pedis intact bilat.   MSK: no deformity of the feet CV: no leg edema Skin:  no ulcer on the feet.  normal color and temp on the feet. Neuro: sensation is intact to touch on the feet.   Lab Results  Component Value Date   HGBA1C 9.8* 03/17/2015   Lab Results  Component Value Date   CREATININE 2.22* 04/04/2015   BUN 47* 04/04/2015   NA 139 04/04/2015   K 4.5 04/04/2015   CL 105 04/04/2015   CO2 26 04/04/2015      Assessment & Plan:  Renal  insufficiency: worse DM: pt says glycemic control is much better over the past few weeks.  We'll check fructosamine. Severe hypoglycemia, recurrent.   Patient is advised the following: Patient Instructions  check your blood sugar 4 times a day.  vary the time of day when you check, between before the 3 meals, and at bedtime.  also check if you have symptoms of your blood sugar being too high or too low.  please keep a record of the readings and bring it to your next appointment here.  You can write it on any piece of paper.  please call us sooner if your blood sugar goes below 70, or if you have a lot of readings over 200.  blood tests are requested for you today.  We'll let you know about the results. Pending the result: Please change the lantus to "basaglar," 12 units at bedtime, and:  Please continue the same humalog. you should eat meals on a regular schedule.  If a meal is missed or significantly delayed, your blood sugar could go low. Please come back for a follow-up appointment in 2 months.    addendum: ref nephrol

## 2015-04-04 NOTE — Patient Instructions (Addendum)
check your blood sugar 4 times a day.  vary the time of day when you check, between before the 3 meals, and at bedtime.  also check if you have symptoms of your blood sugar being too high or too low.  please keep a record of the readings and bring it to your next appointment here.  You can write it on any piece of paper.  please call us sooner if your blood sugar goes below 70, or if you have a lot of readings over 200.  blood tests are requested for you today.  We'll let you know about the results. Pending the result: Please change the lantus to "basaglar," 12 units at bedtime, and:  Please continue the same humalog. you should eat meals on a regular schedule.  If a meal is missed or significantly delayed, your blood sugar could go low. Please come back for a follow-up appointment in 2 months.

## 2015-04-06 LAB — FRUCTOSAMINE: Fructosamine: 343 umol/L — ABNORMAL HIGH (ref 190–270)

## 2015-04-13 ENCOUNTER — Encounter (HOSPITAL_COMMUNITY): Payer: Self-pay | Admitting: *Deleted

## 2015-04-13 ENCOUNTER — Observation Stay (HOSPITAL_COMMUNITY): Payer: Managed Care, Other (non HMO)

## 2015-04-13 ENCOUNTER — Inpatient Hospital Stay (HOSPITAL_COMMUNITY)
Admission: EM | Admit: 2015-04-13 | Discharge: 2015-04-14 | DRG: 392 | Disposition: A | Discharge status: Home / Self Care | Payer: Managed Care, Other (non HMO) | Attending: Internal Medicine | Admitting: Internal Medicine

## 2015-04-13 DIAGNOSIS — K529 Noninfective gastroenteritis and colitis, unspecified: Secondary | ICD-10-CM

## 2015-04-13 DIAGNOSIS — Z8249 Family history of ischemic heart disease and other diseases of the circulatory system: Secondary | ICD-10-CM

## 2015-04-13 DIAGNOSIS — E875 Hyperkalemia: Secondary | ICD-10-CM | POA: Diagnosis present

## 2015-04-13 DIAGNOSIS — A084 Viral intestinal infection, unspecified: Secondary | ICD-10-CM | POA: Diagnosis not present

## 2015-04-13 DIAGNOSIS — Z794 Long term (current) use of insulin: Secondary | ICD-10-CM | POA: Diagnosis not present

## 2015-04-13 DIAGNOSIS — E860 Dehydration: Secondary | ICD-10-CM | POA: Diagnosis present

## 2015-04-13 DIAGNOSIS — E1022 Type 1 diabetes mellitus with diabetic chronic kidney disease: Secondary | ICD-10-CM | POA: Diagnosis present

## 2015-04-13 DIAGNOSIS — N183 Chronic kidney disease, stage 3 unspecified: Secondary | ICD-10-CM | POA: Diagnosis present

## 2015-04-13 DIAGNOSIS — R112 Nausea with vomiting, unspecified: Secondary | ICD-10-CM | POA: Diagnosis not present

## 2015-04-13 DIAGNOSIS — I10 Essential (primary) hypertension: Secondary | ICD-10-CM | POA: Diagnosis not present

## 2015-04-13 DIAGNOSIS — E10319 Type 1 diabetes mellitus with unspecified diabetic retinopathy without macular edema: Secondary | ICD-10-CM | POA: Diagnosis present

## 2015-04-13 DIAGNOSIS — E1021 Type 1 diabetes mellitus with diabetic nephropathy: Secondary | ICD-10-CM | POA: Diagnosis present

## 2015-04-13 DIAGNOSIS — I129 Hypertensive chronic kidney disease with stage 1 through stage 4 chronic kidney disease, or unspecified chronic kidney disease: Secondary | ICD-10-CM | POA: Diagnosis present

## 2015-04-13 DIAGNOSIS — Z833 Family history of diabetes mellitus: Secondary | ICD-10-CM

## 2015-04-13 DIAGNOSIS — E785 Hyperlipidemia, unspecified: Secondary | ICD-10-CM | POA: Diagnosis present

## 2015-04-13 DIAGNOSIS — Z79899 Other long term (current) drug therapy: Secondary | ICD-10-CM

## 2015-04-13 DIAGNOSIS — A0811 Acute gastroenteropathy due to Norwalk agent: Secondary | ICD-10-CM | POA: Diagnosis present

## 2015-04-13 DIAGNOSIS — A419 Sepsis, unspecified organism: Secondary | ICD-10-CM

## 2015-04-13 LAB — BASIC METABOLIC PANEL WITH GFR
Anion gap: 7 (ref 5–15)
BUN: 47 mg/dL — ABNORMAL HIGH (ref 6–20)
CO2: 18 mmol/L — ABNORMAL LOW (ref 22–32)
Calcium: 7.9 mg/dL — ABNORMAL LOW (ref 8.9–10.3)
Chloride: 110 mmol/L (ref 101–111)
Creatinine, Ser: 1.99 mg/dL — ABNORMAL HIGH (ref 0.61–1.24)
GFR calc Af Amer: 49 mL/min — ABNORMAL LOW
GFR calc non Af Amer: 42 mL/min — ABNORMAL LOW
Glucose, Bld: 184 mg/dL — ABNORMAL HIGH (ref 65–99)
Potassium: 6 mmol/L — ABNORMAL HIGH (ref 3.5–5.1)
Sodium: 135 mmol/L (ref 135–145)

## 2015-04-13 LAB — CBC
HEMATOCRIT: 41 % (ref 39.0–52.0)
HEMOGLOBIN: 13.2 g/dL (ref 13.0–17.0)
MCH: 28.8 pg (ref 26.0–34.0)
MCHC: 32.2 g/dL (ref 30.0–36.0)
MCV: 89.5 fL (ref 78.0–100.0)
Platelets: 414 10*3/uL — ABNORMAL HIGH (ref 150–400)
RBC: 4.58 MIL/uL (ref 4.22–5.81)
RDW: 13.7 % (ref 11.5–15.5)
WBC: 16.3 10*3/uL — ABNORMAL HIGH (ref 4.0–10.5)

## 2015-04-13 LAB — URINALYSIS, ROUTINE W REFLEX MICROSCOPIC
BILIRUBIN URINE: NEGATIVE
GLUCOSE, UA: NEGATIVE mg/dL
HGB URINE DIPSTICK: NEGATIVE
KETONES UR: NEGATIVE mg/dL
Leukocytes, UA: NEGATIVE
Nitrite: NEGATIVE
PH: 5 (ref 5.0–8.0)
Protein, ur: NEGATIVE mg/dL
SPECIFIC GRAVITY, URINE: 1.017 (ref 1.005–1.030)

## 2015-04-13 LAB — PROTIME-INR
INR: 1.02 (ref 0.00–1.49)
Prothrombin Time: 13.6 seconds (ref 11.6–15.2)

## 2015-04-13 LAB — COMPREHENSIVE METABOLIC PANEL
ALBUMIN: 5 g/dL (ref 3.5–5.0)
ALT: 33 U/L (ref 17–63)
AST: 30 U/L (ref 15–41)
Alkaline Phosphatase: 55 U/L (ref 38–126)
Anion gap: 7 (ref 5–15)
BILIRUBIN TOTAL: 0.7 mg/dL (ref 0.3–1.2)
BUN: 44 mg/dL — AB (ref 6–20)
CO2: 24 mmol/L (ref 22–32)
Calcium: 9.7 mg/dL (ref 8.9–10.3)
Chloride: 109 mmol/L (ref 101–111)
Creatinine, Ser: 2.19 mg/dL — ABNORMAL HIGH (ref 0.61–1.24)
GFR calc Af Amer: 44 mL/min — ABNORMAL LOW (ref >60)
GFR calc non Af Amer: 38 mL/min — ABNORMAL LOW (ref >60)
GLUCOSE: 156 mg/dL — AB (ref 65–99)
POTASSIUM: 5.9 mmol/L — AB (ref 3.5–5.1)
SODIUM: 140 mmol/L (ref 135–145)
TOTAL PROTEIN: 8.5 g/dL — AB (ref 6.5–8.1)

## 2015-04-13 LAB — PROCALCITONIN: Procalcitonin: 0.28 ng/mL

## 2015-04-13 LAB — GLUCOSE, CAPILLARY: GLUCOSE-CAPILLARY: 155 mg/dL — AB (ref 65–99)

## 2015-04-13 LAB — LACTIC ACID, PLASMA
LACTIC ACID, VENOUS: 0.9 mmol/L (ref 0.5–2.0)
Lactic Acid, Venous: 1 mmol/L (ref 0.5–2.0)

## 2015-04-13 LAB — CBG MONITORING, ED
GLUCOSE-CAPILLARY: 210 mg/dL — AB (ref 65–99)
Glucose-Capillary: 155 mg/dL — ABNORMAL HIGH (ref 65–99)

## 2015-04-13 LAB — C DIFFICILE QUICK SCREEN W PCR REFLEX
C DIFFICILE (CDIFF) INTERP: NEGATIVE
C DIFFICILE (CDIFF) TOXIN: NEGATIVE
C DIFFICLE (CDIFF) ANTIGEN: NEGATIVE

## 2015-04-13 LAB — APTT: aPTT: 28 seconds (ref 24–37)

## 2015-04-13 LAB — LIPASE, BLOOD: Lipase: 23 U/L (ref 11–51)

## 2015-04-13 MED ORDER — PIPERACILLIN-TAZOBACTAM 3.375 G IVPB
3.3750 g | Freq: Three times a day (TID) | INTRAVENOUS | Status: DC
  Administered 2015-04-13 – 2015-04-14 (×3): 3.375 g via INTRAVENOUS
  Filled 2015-04-13 (×4): qty 50

## 2015-04-13 MED ORDER — HEPARIN SODIUM (PORCINE) 5000 UNIT/ML IJ SOLN
5000.0000 [IU] | Freq: Three times a day (TID) | INTRAMUSCULAR | Status: DC
  Administered 2015-04-13 – 2015-04-14 (×3): 5000 [IU] via SUBCUTANEOUS
  Filled 2015-04-13 (×7): qty 1

## 2015-04-13 MED ORDER — OXYCODONE HCL 5 MG PO TABS: 5.0000 mg | ORAL_TABLET | ORAL | Status: DC | PRN

## 2015-04-13 MED ORDER — SODIUM POLYSTYRENE SULFONATE 15 GM/60ML PO SUSP
30.0000 g | Freq: Once | ORAL | Status: AC
  Administered 2015-04-13: 30 g via ORAL
  Filled 2015-04-13: qty 120

## 2015-04-13 MED ORDER — ACETAMINOPHEN 650 MG RE SUPP: 650.0000 mg | Freq: Four times a day (QID) | RECTAL | Status: DC | PRN

## 2015-04-13 MED ORDER — AMLODIPINE BESYLATE 10 MG PO TABS: 10.0000 mg | ORAL_TABLET | Freq: Every day | ORAL | Status: DC

## 2015-04-13 MED ORDER — SIMVASTATIN 10 MG PO TABS
10.0000 mg | ORAL_TABLET | Freq: Every day | ORAL | Status: DC
  Administered 2015-04-13: 10 mg via ORAL
  Filled 2015-04-13 (×2): qty 1

## 2015-04-13 MED ORDER — SODIUM POLYSTYRENE SULFONATE 15 GM/60ML PO SUSP
15.0000 g | Freq: Once | ORAL | Status: AC
  Administered 2015-04-13: 15 g via ORAL
  Filled 2015-04-13: qty 60

## 2015-04-13 MED ORDER — LEVALBUTEROL HCL 0.63 MG/3ML IN NEBU
0.6300 mg | INHALATION_SOLUTION | Freq: Four times a day (QID) | RESPIRATORY_TRACT | Status: DC | PRN
  Filled 2015-04-13: qty 3

## 2015-04-13 MED ORDER — ONDANSETRON HCL 4 MG/2ML IJ SOLN: 4.0000 mg | Freq: Four times a day (QID) | INTRAMUSCULAR | Status: DC | PRN

## 2015-04-13 MED ORDER — SODIUM CHLORIDE 0.9% FLUSH
3.0000 mL | Freq: Two times a day (BID) | INTRAVENOUS | Status: DC
  Administered 2015-04-13: 3 mL via INTRAVENOUS

## 2015-04-13 MED ORDER — SODIUM CHLORIDE 0.9 % IV SOLN
INTRAVENOUS | Status: DC
Start: 2015-04-13 — End: 2015-04-14
  Administered 2015-04-13 – 2015-04-14 (×2): via INTRAVENOUS

## 2015-04-13 MED ORDER — SODIUM CHLORIDE 0.9 % IV BOLUS (SEPSIS)
2000.0000 mL | Freq: Once | INTRAVENOUS | Status: AC
  Administered 2015-04-13: 2000 mL via INTRAVENOUS

## 2015-04-13 MED ORDER — INSULIN GLARGINE 100 UNIT/ML ~~LOC~~ SOLN
20.0000 [IU] | Freq: Every day | SUBCUTANEOUS | Status: DC
  Administered 2015-04-13: 20 [IU] via SUBCUTANEOUS
  Filled 2015-04-13 (×2): qty 0.2

## 2015-04-13 MED ORDER — ACETAMINOPHEN 325 MG PO TABS
650.0000 mg | ORAL_TABLET | Freq: Four times a day (QID) | ORAL | Status: DC | PRN
  Administered 2015-04-13: 650 mg via ORAL
  Filled 2015-04-13: qty 2

## 2015-04-13 MED ORDER — ONDANSETRON 4 MG PO TBDP
4.0000 mg | ORAL_TABLET | Freq: Once | ORAL | Status: AC | PRN
  Administered 2015-04-13: 4 mg via ORAL
  Filled 2015-04-13: qty 1

## 2015-04-13 MED ORDER — INSULIN ASPART 100 UNIT/ML ~~LOC~~ SOLN
0.0000 [IU] | Freq: Three times a day (TID) | SUBCUTANEOUS | Status: DC
  Administered 2015-04-13: 3 [IU] via SUBCUTANEOUS
  Administered 2015-04-14: 2 [IU] via SUBCUTANEOUS
  Filled 2015-04-13: qty 1

## 2015-04-13 MED ORDER — DIPHENOXYLATE-ATROPINE 2.5-0.025 MG PO TABS
2.0000 | ORAL_TABLET | Freq: Once | ORAL | Status: AC
  Administered 2015-04-13: 2 via ORAL
  Filled 2015-04-13: qty 2

## 2015-04-13 MED ORDER — PIPERACILLIN-TAZOBACTAM 3.375 G IVPB 30 MIN
3.3750 g | Freq: Once | INTRAVENOUS | Status: AC
  Administered 2015-04-13: 3.375 g via INTRAVENOUS
  Filled 2015-04-13: qty 50

## 2015-04-13 MED ORDER — ONDANSETRON HCL 4 MG PO TABS: 4.0000 mg | ORAL_TABLET | Freq: Four times a day (QID) | ORAL | Status: DC | PRN

## 2015-04-13 NOTE — ED Notes (Signed)
Pt reports n/v/d and fever that began today approx 0330 - pt now c/o generalized body aches.

## 2015-04-13 NOTE — ED Provider Notes (Signed)
CSN: [id number]     Arrival date & time 04/13/15  0725 History   First MD Initiated Contact with Patient 04/13/15 [phone]     Chief Complaint  Patient presents with  . Emesis  . Diarrhea     HPI  Patient with significant past history of diabetes mellitus, diabetes renal disease, hypertension, hypercholesterolemia/metabolic syndrome.  Patient presents for evaluation nausea vomiting diarrhea and fever. Symptoms started early this morning. Has diffuse bodyaches. His wife is here with him with similar symptoms. He has a history of diabetes and moderate renal insufficiency. Continues on an ACE inhibitor.  Past Medical History  Diagnosis Date  . Diabetes mellitus type I [facility])     onset age 74  . Hyperlipidemia   . CKD (chronic kidney disease) stage 3, GFR 30-59 ml/min 08/16/2014  . Background diabetic retinopathy without macular edema associated with type 1 diabetes mellitus 07/08/2014    [facility] Dione Booze, MD 07/08/2014     . Essential hypertension, benign 08/16/2014   Past Surgical History  Procedure Laterality Date  . Lung surgery  1996    Right Lung   . Laparoscopic appendectomy N/A 03/17/2015    Procedure: APPENDECTOMY LAPAROSCOPIC;  Surgeon: Claud Kelp, MD;  Location: [facility];  Service: General;  Laterality: N/A;   Family History  Problem Relation Age of Onset  . Diabetes Mother     DMII  . Hypertension Mother   . Diabetes Father     DMII  . Hypertension Father   . Hyperlipidemia Father   . Depression Father    Social History  Substance Use Topics  . Smoking status: Never Smoker   . Smokeless tobacco: None  . Alcohol Use: 0.0 oz/week    0 Standard drinks or equivalent per week     Comment: Beer - ocassionally     Review of Systems  Constitutional: Positive for fever. Negative for chills, diaphoresis, appetite change and fatigue.  HENT: Negative for mouth sores, sore throat and trouble swallowing.   Eyes: Negative for visual disturbance.   Respiratory: Negative for cough, chest tightness, shortness of breath and wheezing.   Cardiovascular: Negative for chest pain.  Gastrointestinal: Positive for nausea, vomiting and diarrhea. Negative for abdominal pain and abdominal distention.  Endocrine: Negative for polydipsia, polyphagia and polyuria.  Genitourinary: Negative for dysuria, frequency and hematuria.  Musculoskeletal: Negative for gait problem.  Skin: Negative for color change, pallor and rash.  Neurological: Negative for dizziness, syncope, light-headedness and headaches.  Hematological: Does not bruise/bleed easily.  Psychiatric/Behavioral: Negative for behavioral problems and confusion.      Allergies  Review of patient's allergies indicates no known allergies.  Home Medications   Prior to Admission medications   Medication Sig Start Date End Date Taking? Authorizing Provider  amLODipine (NORVASC) 10 MG tablet Take 1 tablet (10 mg total) by mouth daily. 03/18/15  Yes Jerald Kief, MD  glucagon (GLUCAGON EMERGENCY) 1 MG injection Inject 1 mg into the muscle once as needed. 06/30/14  Yes Romero Belling, MD  insulin aspart (NOVOLOG FLEXPEN) 100 UNIT/ML FlexPen Inject 8 Units into the skin 3 (three) times daily with meals. And pen needles 4/day 09/15/14  Yes Romero Belling, MD  insulin glargine (LANTUS) 100 UNIT/ML injection Inject 20 Units into the skin at bedtime.   Yes Historical Provider, MD  Insulin Pen Needle 33G X 6 MM MISC 1 Device by Does not apply route 4 (four) times daily. 04/04/15  Yes Romero Belling, MD  lisinopril (PRINIVIL,ZESTRIL)  5 MG tablet Take 5 mg by mouth daily.   Yes Historical Provider, MD  simvastatin (ZOCOR) 5 MG tablet TAKE 1 TABLET(5 MG) BY MOUTH DAILY 01/14/15  Yes Ethelda Chick, MD  Insulin Glargine (BASAGLAR KWIKPEN) 100 UNIT/ML Solostar Pen Inject 12 Units into the skin at bedtime. Patient not taking: Reported on 04/13/2015 04/04/15   Romero Belling, MD   BP 109/50 mmHg  Pulse 90  Temp(Src) 98.7 F  (37.1 C) (Oral)  Resp 17  SpO2 97% Physical Exam  Constitutional: He is oriented to person, place, and time. He appears well-developed and well-nourished. No distress.  HENT:  Head: Normocephalic.  Eyes: Conjunctivae are normal. Pupils are equal, round, and reactive to light. No scleral icterus.  Neck: Normal range of motion. Neck supple. No thyromegaly present.  Cardiovascular: Normal rate and regular rhythm.  Exam reveals no gallop and no friction rub.   No murmur heard. Pulmonary/Chest: Effort normal and breath sounds normal. No respiratory distress. He has no wheezes. He has no rales.  Abdominal: Soft. Bowel sounds are normal. He exhibits no distension. There is no tenderness. There is no rebound.  Musculoskeletal: Normal range of motion.  Neurological: He is alert and oriented to person, place, and time.  Skin: Skin is warm and dry. No rash noted.  Psychiatric: He has a normal mood and affect. His behavior is normal.    ED Course  Procedures (including critical care time) Labs Review Labs Reviewed  COMPREHENSIVE METABOLIC PANEL - Abnormal; Notable for the following:    Potassium 5.9 (*)    Glucose, Bld 156 (*)    BUN 44 (*)    Creatinine, Ser 2.19 (*)    Total Protein 8.5 (*)    GFR calc non Af Amer 38 (*)    GFR calc Af Amer 44 (*)    All other components within normal limits  CBC - Abnormal; Notable for the following:    WBC 16.3 (*)    Platelets 414 (*)    All other components within normal limits  BASIC METABOLIC PANEL - Abnormal; Notable for the following:    Potassium 6.0 (*)    CO2 18 (*)    Glucose, Bld 184 (*)    BUN 47 (*)    Creatinine, Ser 1.99 (*)    Calcium 7.9 (*)    GFR calc non Af Amer 42 (*)    GFR calc Af Amer 49 (*)    All other components within normal limits  CBG MONITORING, ED - Abnormal; Notable for the following:    Glucose-Capillary 155 (*)    All other components within normal limits  LIPASE, BLOOD  URINALYSIS, ROUTINE W REFLEX  MICROSCOPIC (NOT AT [facility])    Imaging Review No results found. I have personally reviewed and evaluated these images and lab results as part of my medical decision-making.   EKG Interpretation None      MDM   Final diagnoses:  Hyperkalemia  Gastroenteritis    Patient's symptoms improved with antiemetics. Initial labs show potassium 5.9. Creatinine near his baseline at 2.1. Given 2 L IV fluid. Potassium rechecked 6.0.  EKG pending.  Discussion: With increasing potassium think admission for observation telemetry monitoring is indicated. Has not responded initially to bolus IV fluids. He does take an ACE inhibitor which will be held. Is not taking potassium supplements.    Rolland Porter, MD 04/13/15 1255

## 2015-04-13 NOTE — ED Notes (Signed)
Bed: WA32 Expected date:  Expected time:  Means of arrival:  Comments: Hold for room 13 

## 2015-04-13 NOTE — ED Notes (Signed)
IV fluids still infusing

## 2015-04-13 NOTE — Progress Notes (Addendum)
Pt is on contact precautions - Pt vital signs are stable- FSBS 210- pt given 3 units of insulin novologue. Pt had about 300cc foul smelling liquid light brown stool. Pt has an IV Nss up one liter infusing at 100cc/hr.

## 2015-04-13 NOTE — Progress Notes (Signed)
Pharmacy Antibiotic Note  Jeffrey Hale is a 35 y.o. male admitted on 04/13/2015 with c/o diarrhea and emesis.  Pharmacy has been consulted for zosyn  Dosing for suspected intra-abdominal infection and PNA.  - 2/15 CXR: LL PNA - afeb, wbc 16.3, scr 1.99 (crcl~45)   Plan: - zosyn 3.375 gm IV x1 over 30 minutes, then 3.375 gm IV q8h (over 4 hours)    Temp (24hrs), Avg:99.1 F (37.3 C), Min:98.7 F (37.1 C), Max:99.4 F (37.4 C)   Recent Labs Lab 04/13/15 0834 04/13/15 1135  WBC 16.3*  --   CREATININE 2.19* 1.99*    Estimated Creatinine Clearance: 45.1 mL/min (by C-G formula based on Cr of 1.99).    No Known Allergies  Antimicrobials this admission: 2/15 zosyn>>  Dose adjustments this admission: n/a  Microbiology results:   Thank you for allowing pharmacy to be a part of this patient's care.  Lucia Gaskins 04/13/2015 1:58 PM

## 2015-04-13 NOTE — H&P (Addendum)
[facility] History and Physical  Jeffrey Hale ZOX:[id number] DOB: [birth year] DOA: 04/13/2015  Referring physician: *  PCP: Nilda Simmer, MD   Chief Complaint:  Vomiting and diarrhea  HPI:  35 year old male with a history of diabetes mellitus type 1, on insulin, CK D stage III, hypertension, who presents to the ER because of nausea, nonbilious vomiting and diarrhea associated with fever that woke him up at 3:30 this morning. Patient presents with generalized myalgias, has had 4 episodes of diarrhea this morning, nonbloody,. Patient denies any cough, chest pain, shortness of breath. Patient found to have potassium of 6.0 in the ER. Also found to have borderline low blood pressure. Lactic acid 0.9. Patient given lomotil in  the ER for diarrhea. Patient being admitted for probable sepsis, secondary to colitis/gastroenteritis.      Review of Systems: negative for the following  Constitutional: Positive for fever, chills, diaphoresis, appetite change and fatigue.  HEENT: Denies photophobia, eye pain, redness, hearing loss, ear pain, congestion, sore throat, rhinorrhea, sneezing, mouth sores, trouble swallowing, neck pain, neck stiffness and tinnitus.  Respiratory: Denies SOB, DOE, cough, chest tightness, and wheezing.  Cardiovascular: Denies chest pain, palpitations and leg swelling.  Gastrointestinal: Positive for nausea, vomiting, abdominal pain, diarrhea, constipation, blood in stool and abdominal distention.  Genitourinary: Denies dysuria, urgency, frequency, hematuria, flank pain and difficulty urinating.  Musculoskeletal: Denies myalgias, back pain, joint swelling, arthralgias and gait problem.  Skin: Denies pallor, rash and wound.  Neurological: Denies dizziness, seizures, syncope, weakness, light-headedness, numbness and headaches.  Hematological: Denies adenopathy. Easy bruising, personal or family bleeding history  Psychiatric/Behavioral: Denies suicidal ideation, mood  changes, confusion, nervousness, sleep disturbance and agitation       Past Medical History  Diagnosis Date  . Diabetes mellitus type I [facility])     onset age 41  . Hyperlipidemia   . CKD (chronic kidney disease) stage 3, GFR 30-59 ml/min 08/16/2014  . 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 Dione Booze, MD 07/08/2014     . Essential hypertension, benign 08/16/2014     Past Surgical History  Procedure Laterality Date  . Lung surgery  1996    Right Lung   . Laparoscopic appendectomy N/A 03/17/2015    Procedure: APPENDECTOMY LAPAROSCOPIC;  Surgeon: Claud Kelp, MD;  Location: [facility];  Service: General;  Laterality: N/A;      Social History:  reports that he has never smoked. He does not have any smokeless tobacco history on file. He reports that he drinks alcohol. He reports that he does not use illicit drugs.    No Known Allergies  Family History  Problem Relation Age of Onset  . Diabetes Mother     DMII  . Hypertension Mother   . Diabetes Father     DMII  . Hypertension Father   . Hyperlipidemia Father   . Depression Father         Prior to Admission medications   Medication Sig Start Date End Date Taking? Authorizing Provider  amLODipine (NORVASC) 10 MG tablet Take 1 tablet (10 mg total) by mouth daily. 03/18/15  Yes Jerald Kief, MD  glucagon (GLUCAGON EMERGENCY) 1 MG injection Inject 1 mg into the muscle once as needed. 06/30/14  Yes Romero Belling, MD  insulin aspart (NOVOLOG FLEXPEN) 100 UNIT/ML FlexPen Inject 8 Units into the skin 3 (three) times daily with meals. And pen needles 4/day 09/15/14  Yes Romero Belling, MD  insulin glargine (LANTUS) 100 UNIT/ML injection Inject 20 Units into the skin at bedtime.   Yes Historical Provider, MD  Insulin Pen Needle 33G X 6 MM MISC 1 Device by Does not apply route 4 (four) times daily. 04/04/15  Yes Romero Belling, MD   lisinopril (PRINIVIL,ZESTRIL) 5 MG tablet Take 5 mg by mouth daily.   Yes Historical Provider, MD  simvastatin (ZOCOR) 5 MG tablet TAKE 1 TABLET(5 MG) BY MOUTH DAILY 01/14/15  Yes Ethelda Chick, MD  Insulin Glargine (BASAGLAR KWIKPEN) 100 UNIT/ML Solostar Pen Inject 12 Units into the skin at bedtime. Patient not taking: Reported on 04/13/2015 04/04/15   Romero Belling, MD     Physical Exam: Filed Vitals:   04/13/15 0737 04/13/15 1214 04/13/15 1452  BP: 119/76 109/50 124/70  Pulse:  90 99  Temp: 99.4 F (37.4 C) 98.7 F (37.1 C)   TempSrc: Oral Oral   Resp: 18 17 16   SpO2: 98% 97% 98%     Constitutional: Vital signs reviewed. Patient is a well-developed and well-nourished in no acute distress and cooperative with exam. Alert and oriented x3.  Head: Normocephalic and atraumatic  Ear: TM normal bilaterally  Mouth: no erythema or exudates, MMM  Eyes: PERRL, EOMI, conjunctivae normal, No scleral icterus.  Neck: Supple, Trachea midline normal ROM, No JVD, mass, thyromegaly, or carotid bruit present.  Cardiovascular: RRR, S1 normal, S2 normal, no MRG, pulses symmetric and intact bilaterally  Pulmonary/Chest: CTAB, no wheezes, rales, or rhonchi  Abdominal: Soft. Non-tender, non-distended, bowel sounds are normal, no masses, organomegaly, or guarding present.  GU: no CVA tenderness Musculoskeletal: No joint deformities, erythema, or stiffness, ROM full and no nontender Ext: no edema and no cyanosis, pulses palpable bilaterally (DP and PT)  Hematology: no cervical, inginal, or axillary adenopathy.  Neurological: A&O x3, Strenght is normal and symmetric bilaterally, cranial nerve II-XII are grossly intact, no focal motor deficit, sensory intact to light touch bilaterally.  Skin: Warm, dry and intact. No rash, cyanosis, or clubbing.  Psychiatric: Normal mood and affect. speech and behavior is normal. Judgment and thought content normal. Cognition and memory are normal.      Data Review    Micro Results No results found for this or any previous visit (from the past 240 hour(s)).  Radiology Reports Ct Abdomen Pelvis Wo Contrast  03/17/2015  CLINICAL DATA:  Central abdominal pain with nausea and vomiting, fever. History of hypertension, hyperlipidemia, diabetes and RIGHT lung surgery. EXAM: CT ABDOMEN AND PELVIS WITHOUT CONTRAST TECHNIQUE: Multidetector CT imaging of the abdomen and pelvis was performed following the standard protocol without IV contrast. COMPARISON:  None. FINDINGS: LUNG BASES: Mild scarring in RIGHT lung base, with dependent atelectasis ; focal thickening of the medial RIGHT pleural reflection is likely postoperative. The visualized heart and pericardium are unremarkable. KIDNEYS/BLADDER: Kidneys are orthotopic, demonstrating normal size and morphology. No nephrolithiasis, hydronephrosis; limited assessment for renal masses on this nonenhanced examination. The unopacified ureters are normal in course and caliber. Urinary bladder is partially distended and unremarkable. SOLID ORGANS: Punctate layering gallstones. No CT findings of acute cholecystitis. The liver, spleen, pancreas and adrenal glands are unremarkable for this non-contrast examination. GASTROINTESTINAL TRACT: The stomach, small and large bowel are normal in course and caliber without inflammatory changes, the sensitivity may be decreased by lack of enteric contrast. Enteric contrast has not yet reached the distal small bowel. The appendix is enlarged, 13 mm in transaxial dimension with periappendiceal inflammation, no focal fluid collection or appendicolith. PERITONEUM/RETROPERITONEUM: Aortoiliac vessels  are normal in course and caliber. No lymphadenopathy by CT size criteria. Prostate size is normal. No intraperitoneal free fluid nor free air. SOFT TISSUES/ OSSEOUS STRUCTURES:  Nonsuspicious. IMPRESSION: Acute appendicitis without complication. Cholelithiasis without CT findings of acute cholecystitis. Acute  findings discussed with and reconfirmed by Dr.Reeves on 03/17/2015 at 1:43 am. Electronically Signed   By: Awilda Metro M.D.   On: 03/17/2015 01:43   Dg Chest 2 View  03/17/2015  CLINICAL DATA:  Acute onset of vomiting. Sepsis. Initial encounter. EXAM: CHEST  2 VIEW COMPARISON:  None. FINDINGS: The lungs are hypoexpanded. Mild vascular crowding is noted. There is no evidence of focal opacification, pleural effusion or pneumothorax. The heart is borderline normal in size. No acute osseous abnormalities are seen. IMPRESSION: Lungs hypoexpanded but grossly clear. Electronically Signed   By: Roanna Raider M.D.   On: 03/17/2015 02:24   Dg Chest Port 1 View  04/13/2015  CLINICAL DATA:  Nausea, vomiting, diarrhea and fever beginning today. EXAM: PORTABLE CHEST 1 VIEW COMPARISON:  03/17/2015 FINDINGS: Heart size is normal. Mediastinal shadows are normal. There is lower lobe infiltrate consistent with pneumonia, definitely present at the medial right base and possibly in the left lower lobe is well. No dense consolidation or lobar collapse. IMPRESSION: Lower lobe pneumonia, definite on the right and possible on the left. Electronically Signed   By: Paulina Fusi M.D.   On: 04/13/2015 13:32     CBC  Recent Labs Lab 04/13/15 0834  WBC 16.3*  HGB 13.2  HCT 41.0  PLT 414*  MCV 89.5  MCH 28.8  MCHC 32.2  RDW 13.7    Chemistries   Recent Labs Lab 04/13/15 0834 04/13/15 1135  NA 140 135  K 5.9* 6.0*  CL 109 110  CO2 24 18*  GLUCOSE 156* 184*  BUN 44* 47*  CREATININE 2.19* 1.99*  CALCIUM 9.7 7.9*  AST 30  --   ALT 33  --   ALKPHOS 55  --   BILITOT 0.7  --    ------------------------------------------------------------------------------------------------------------------ estimated creatinine clearance is 45.1 mL/min (by C-G formula based on Cr of 1.99). ------------------------------------------------------------------------------------------------------------------ No results for  input(s): HGBA1C in the last 72 hours. ------------------------------------------------------------------------------------------------------------------ No results for input(s): CHOL, HDL, LDLCALC, TRIG, CHOLHDL, LDLDIRECT in the last 72 hours. ------------------------------------------------------------------------------------------------------------------ No results for input(s): TSH, T4TOTAL, T3FREE, THYROIDAB in the last 72 hours.  Invalid input(s): FREET3 ------------------------------------------------------------------------------------------------------------------ No results for input(s): VITAMINB12, FOLATE, FERRITIN, TIBC, IRON, RETICCTPCT in the last 72 hours.  Coagulation profile  Recent Labs Lab 04/13/15 1441  INR 1.02    No results for input(s): DDIMER in the last 72 hours.  Cardiac Enzymes No results for input(s): CKMB, TROPONINI, MYOGLOBIN in the last 168 hours.  Invalid input(s): CK ------------------------------------------------------------------------------------------------------------------ Invalid input(s): POCBNP   CBG:  Recent Labs Lab 04/13/15 0812  GLUCAP 155*        Assessment/Plan Acute gastroenteritis associated with probable sepsis Patient started on Zosyn, recent surgery for appendicitis from which he is healing well GI pathogen panel, blood culture 2, Abnormal liver function, lipase, Negative UA, normal lactate DC antibiotics abnormal. Infection is found    Hyperkalemia-likely the setting of ACE inhibitor use, hold ACE inhibitor and hydrate the patient, status was receiving Kayexalate 1     Essential hypertension, benign-hold antihypertensive medications in the setting of dehydration    CKD (chronic kidney disease) stage 3, GFR 30-59 ml/min-baseline creatinine around 1.8, creatinine is at baseline History of diabetic nephropathy    Hyperlipidemia-continue with statin  Type 1 diabetes mellitus with nephropathy  (HCC)-continue SSI, continue Lantus 20 units at bedtime,Follows with Dr. Everardo All        Code Status Orders        Start     Ordered   04/13/15 1329  Full code   Continuous     04/13/15 1329    Family Communication: bedside Disposition Plan: admit   Total time spent 55 minutes.Greater than 50% of this time was spent in counseling, explanation of diagnosis, planning of further management, and coordination of care  [employer] [facility] Pager [phone]  If 7PM-7AM, please contact night-coverage [email or web address] Password [facility] 04/13/2015, 3:32 PM

## 2015-04-14 LAB — COMPREHENSIVE METABOLIC PANEL
ALK PHOS: 39 U/L (ref 38–126)
ALT: 21 U/L (ref 17–63)
ANION GAP: 6 (ref 5–15)
AST: 17 U/L (ref 15–41)
Albumin: 3.5 g/dL (ref 3.5–5.0)
BILIRUBIN TOTAL: 0.6 mg/dL (ref 0.3–1.2)
BUN: 36 mg/dL — ABNORMAL HIGH (ref 6–20)
CALCIUM: 8.3 mg/dL — AB (ref 8.9–10.3)
CO2: 24 mmol/L (ref 22–32)
Chloride: 110 mmol/L (ref 101–111)
Creatinine, Ser: 2.1 mg/dL — ABNORMAL HIGH (ref 0.61–1.24)
GFR calc Af Amer: 46 mL/min — ABNORMAL LOW (ref >60)
GFR, EST NON AFRICAN AMERICAN: 40 mL/min — AB (ref >60)
Glucose, Bld: 90 mg/dL (ref 65–99)
POTASSIUM: 4.3 mmol/L (ref 3.5–5.1)
Sodium: 140 mmol/L (ref 135–145)
TOTAL PROTEIN: 6.1 g/dL — AB (ref 6.5–8.1)

## 2015-04-14 LAB — CBC
HEMATOCRIT: 33.2 % — AB (ref 39.0–52.0)
Hemoglobin: 10.7 g/dL — ABNORMAL LOW (ref 13.0–17.0)
MCH: 28.9 pg (ref 26.0–34.0)
MCHC: 32.2 g/dL (ref 30.0–36.0)
MCV: 89.7 fL (ref 78.0–100.0)
Platelets: 273 10*3/uL (ref 150–400)
RBC: 3.7 MIL/uL — ABNORMAL LOW (ref 4.22–5.81)
RDW: 13.9 % (ref 11.5–15.5)
WBC: 6 10*3/uL (ref 4.0–10.5)

## 2015-04-14 LAB — GASTROINTESTINAL PANEL BY PCR, STOOL (REPLACES STOOL CULTURE)
ADENOVIRUS F40/41: NOT DETECTED
ASTROVIRUS: NOT DETECTED
CYCLOSPORA CAYETANENSIS: NOT DETECTED
Campylobacter species: NOT DETECTED
Cryptosporidium: NOT DETECTED
E. coli O157: NOT DETECTED
ENTAMOEBA HISTOLYTICA: NOT DETECTED
ENTEROAGGREGATIVE E COLI (EAEC): NOT DETECTED
ENTEROPATHOGENIC E COLI (EPEC): NOT DETECTED
ENTEROTOXIGENIC E COLI (ETEC): NOT DETECTED
Giardia lamblia: NOT DETECTED
Norovirus GI/GII: DETECTED — AB
Plesimonas shigelloides: NOT DETECTED
Rotavirus A: NOT DETECTED
Salmonella species: NOT DETECTED
Sapovirus (I, II, IV, and V): NOT DETECTED
Shiga like toxin producing E coli (STEC): NOT DETECTED
Shigella/Enteroinvasive E coli (EIEC): NOT DETECTED
VIBRIO CHOLERAE: NOT DETECTED
VIBRIO SPECIES: NOT DETECTED
Yersinia enterocolitica: NOT DETECTED

## 2015-04-14 LAB — GLUCOSE, CAPILLARY
GLUCOSE-CAPILLARY: 49 mg/dL — AB (ref 65–99)
GLUCOSE-CAPILLARY: 85 mg/dL (ref 65–99)
GLUCOSE-CAPILLARY: 158 mg/dL — AB (ref 65–99)

## 2015-04-14 LAB — HEMOGLOBIN A1C
HEMOGLOBIN A1C: 8.5 % — AB (ref 4.8–5.6)
Mean Plasma Glucose: 197 mg/dL

## 2015-04-14 MED ORDER — AMLODIPINE BESYLATE 10 MG PO TABS: 10.0000 mg | ORAL_TABLET | Freq: Every day | ORAL | Status: AC

## 2015-04-14 MED ORDER — INSULIN GLARGINE 100 UNIT/ML ~~LOC~~ SOLN: 15.0000 [IU] | Freq: Every day | SUBCUTANEOUS | Status: DC

## 2015-04-14 MED ORDER — CLONIDINE HCL 0.1 MG PO TABS: 0.1000 mg | ORAL_TABLET | Freq: Two times a day (BID) | ORAL | Status: DC

## 2015-04-14 NOTE — Progress Notes (Signed)
Hypoglycemic Event  CBG: 48  Treatment: Juice  Symptoms: None  Follow-up CBG: Time: 0830 CBG Result:84

## 2015-04-14 NOTE — Discharge Instructions (Signed)
Patient to return if he experiences nausea vomiting diarrhea, fever, chest pain, shortness of breath

## 2015-04-14 NOTE — Discharge Summary (Addendum)
Physician Discharge Summary  Jeffrey Hale MRN: [id number] DOB/AGE: 1980-11-28 35 y.o.  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  Start taking on:  04/21/2015     glucagon 1 MG injection  Commonly known as:  GLUCAGON EMERGENCY  Inject 1 mg into the muscle once as needed.     insulin aspart 100 UNIT/ML FlexPen  Commonly known as:  NOVOLOG FLEXPEN  Inject 8 Units into the skin 3 (three) times daily with meals. And pen needles 4/day     insulin glargine 100 UNIT/ML injection  Commonly known as:  LANTUS  Inject 0.15 mLs (15 Units total) into the skin at bedtime.     Insulin Pen Needle 33G X 6 MM Misc  1 Device by Does not apply route 4 (four) times daily.     simvastatin 5 MG tablet  Commonly known as:  ZOCOR  TAKE 1 TABLET(5 MG) BY MOUTH DAILY          Discharge Condition: Stable   Discharge Instructions       Discharge Instructions    Diet - low sodium heart healthy    Complete by:  As directed      Increase activity slowly    Complete by:  As directed            No Known Allergies    Disposition: 01-Home or Self  Care   Consults:  None   Significant Diagnostic Studies:  Ct Abdomen Pelvis Wo Contrast  03/17/2015  CLINICAL DATA:  Central abdominal pain with nausea and vomiting, fever. History of hypertension, hyperlipidemia, diabetes and RIGHT lung surgery. EXAM: CT ABDOMEN AND PELVIS WITHOUT CONTRAST TECHNIQUE: Multidetector CT imaging of the abdomen and pelvis was performed following the standard protocol without IV contrast. COMPARISON:  None. FINDINGS: LUNG BASES: Mild scarring in RIGHT lung base, with dependent atelectasis ; focal thickening of the medial RIGHT pleural reflection is likely postoperative. The visualized heart and pericardium are unremarkable. KIDNEYS/BLADDER: Kidneys are orthotopic, demonstrating normal size and morphology. No nephrolithiasis, hydronephrosis; limited assessment for renal masses on this nonenhanced examination. The unopacified ureters are normal in course and caliber. Urinary bladder is partially distended and unremarkable. SOLID ORGANS: Punctate layering gallstones. No CT findings of acute cholecystitis. The liver, spleen, pancreas and adrenal glands are unremarkable for this non-contrast examination. GASTROINTESTINAL TRACT: The stomach, small and large bowel are normal in course and caliber without inflammatory changes, the sensitivity may be decreased by lack of enteric contrast. Enteric contrast has not yet reached the distal small bowel. The appendix is enlarged, 13  mm in transaxial dimension with periappendiceal inflammation, no focal fluid collection or appendicolith. PERITONEUM/RETROPERITONEUM: Aortoiliac vessels are normal in course and caliber. No lymphadenopathy by CT size criteria. Prostate size is normal. No intraperitoneal free fluid nor free air. SOFT TISSUES/ OSSEOUS STRUCTURES:  Nonsuspicious. IMPRESSION: Acute appendicitis without complication. Cholelithiasis without CT findings of acute cholecystitis. Acute findings discussed with and reconfirmed by Dr.Reeves on  03/17/2015 at 1:43 am. Electronically Signed   By: Elon Alas M.D.   On: 03/17/2015 01:43   Dg Chest 2 View  03/17/2015  CLINICAL DATA:  Acute onset of vomiting. Sepsis. Initial encounter. EXAM: CHEST  2 VIEW COMPARISON:  None. FINDINGS: The lungs are hypoexpanded. Mild vascular crowding is noted. There is no evidence of focal opacification, pleural effusion or pneumothorax. The heart is borderline normal in size. No acute osseous abnormalities are seen. IMPRESSION: Lungs hypoexpanded but grossly clear. Electronically Signed   By: Garald Balding M.D.   On: 03/17/2015 02:24   Dg Chest Port 1 View  04/13/2015  CLINICAL DATA:  Nausea, vomiting, diarrhea and fever beginning today. EXAM: PORTABLE CHEST 1 VIEW COMPARISON:  03/17/2015 FINDINGS: Heart size is normal. Mediastinal shadows are normal. There is lower lobe infiltrate consistent with pneumonia, definitely present at the medial right base and possibly in the left lower lobe is well. No dense consolidation or lobar collapse. IMPRESSION: Lower lobe pneumonia, definite on the right and possible on the left. Electronically Signed   By: Nelson Chimes M.D.   On: 04/13/2015 13:32           Microbiology: Recent Results (from the past 240 hour(s))  Culture, blood (x 2)     Status: None (Preliminary result)   Collection Time: 04/13/15  2:14 PM  Result Value Ref Range Status   Specimen Description BLOOD BLOOD LEFT FOREARM  Final   Special Requests BOTTLES DRAWN AEROBIC AND ANAEROBIC 5CC EACH  Final   Culture   Final    NO GROWTH < 24 HOURS Performed at [facility]    Report Status PENDING  Incomplete  Culture, blood (x 2)     Status: None (Preliminary result)   Collection Time: 04/13/15  2:34 PM  Result Value Ref Range Status   Specimen Description BLOOD RIGHT ANTECUBITAL  Final   Special Requests BOTTLES DRAWN AEROBIC AND ANAEROBIC 5CC EACH  Final   Culture   Final    NO GROWTH < 24 HOURS Performed at [facility]     Report Status PENDING  Incomplete  C difficile quick scan w PCR reflex     Status: None   Collection Time: 04/13/15  6:00 PM  Result Value Ref Range Status   C Diff antigen NEGATIVE NEGATIVE Final   C Diff toxin NEGATIVE NEGATIVE Final   C Diff interpretation Negative for toxigenic C. difficile  Final       Blood Culture    Component Value Date/Time   SDES BLOOD RIGHT ANTECUBITAL 04/13/2015 1434   SPECREQUEST BOTTLES DRAWN AEROBIC AND ANAEROBIC 5CC EACH 04/13/2015 1434   CULT  04/13/2015 1434    NO GROWTH < 24 HOURS Performed at [facility] PENDING 04/13/2015 1434      Labs: Results for orders placed or performed during the hospital encounter of 04/13/15 (from the past 48 hour(s))  POC CBG, ED     Status: Abnormal   Collection Time: 04/13/15  8:12 AM  Result Value Ref Range   Glucose-Capillary 155 (H) 65 -  99 mg/dL  Lipase, blood     Status: None   Collection Time: 04/13/15  8:34 AM  Result Value Ref Range   Lipase 23 11 - 51 U/L  Comprehensive metabolic panel     Status: Abnormal   Collection Time: 04/13/15  8:34 AM  Result Value Ref Range   Sodium 140 135 - 145 mmol/L   Potassium 5.9 (H) 3.5 - 5.1 mmol/L    Comment: NO VISIBLE HEMOLYSIS   Chloride 109 101 - 111 mmol/L   CO2 24 22 - 32 mmol/L   Glucose, Bld 156 (H) 65 - 99 mg/dL   BUN 44 (H) 6 - 20 mg/dL   Creatinine, Ser 2.19 (H) 0.61 - 1.24 mg/dL   Calcium 9.7 8.9 - 10.3 mg/dL   Total Protein 8.5 (H) 6.5 - 8.1 g/dL   Albumin 5.0 3.5 - 5.0 g/dL   AST 30 15 - 41 U/L   ALT 33 17 - 63 U/L   Alkaline Phosphatase 55 38 - 126 U/L   Total Bilirubin 0.7 0.3 - 1.2 mg/dL   GFR calc non Af Amer 38 (L) >60 mL/min   GFR calc Af Amer 44 (L) >60 mL/min    Comment: (NOTE) The eGFR has been calculated using the CKD EPI equation. This calculation has not been validated in all clinical situations. eGFR's persistently <60 mL/min signify possible Chronic Kidney Disease.    Anion gap 7 5 - 15  CBC      Status: Abnormal   Collection Time: 04/13/15  8:34 AM  Result Value Ref Range   WBC 16.3 (H) 4.0 - 10.5 K/uL   RBC 4.58 4.22 - 5.81 MIL/uL   Hemoglobin 13.2 13.0 - 17.0 g/dL   HCT 41.0 39.0 - 52.0 %   MCV 89.5 78.0 - 100.0 fL   MCH 28.8 26.0 - 34.0 pg   MCHC 32.2 30.0 - 36.0 g/dL   RDW 13.7 11.5 - 15.5 %   Platelets 414 (H) 150 - 400 K/uL  Urinalysis, Routine w reflex microscopic (not at [facility])     Status: None   Collection Time: 04/13/15 10:30 AM  Result Value Ref Range   Color, Urine YELLOW YELLOW   APPearance CLEAR CLEAR   Specific Gravity, Urine 1.017 1.005 - 1.030   pH 5.0 5.0 - 8.0   Glucose, UA NEGATIVE NEGATIVE mg/dL   Hgb urine dipstick NEGATIVE NEGATIVE   Bilirubin Urine NEGATIVE NEGATIVE   Ketones, ur NEGATIVE NEGATIVE mg/dL   Protein, ur NEGATIVE NEGATIVE mg/dL   Nitrite NEGATIVE NEGATIVE   Leukocytes, UA NEGATIVE NEGATIVE    Comment: MICROSCOPIC NOT DONE ON URINES WITH NEGATIVE PROTEIN, BLOOD, LEUKOCYTES, NITRITE, OR GLUCOSE <1000 mg/dL.  Basic metabolic panel     Status: Abnormal   Collection Time: 04/13/15 11:35 AM  Result Value Ref Range   Sodium 135 135 - 145 mmol/L   Potassium 6.0 (H) 3.5 - 5.1 mmol/L    Comment: NO VISIBLE HEMOLYSIS   Chloride 110 101 - 111 mmol/L   CO2 18 (L) 22 - 32 mmol/L   Glucose, Bld 184 (H) 65 - 99 mg/dL   BUN 47 (H) 6 - 20 mg/dL   Creatinine, Ser 1.99 (H) 0.61 - 1.24 mg/dL   Calcium 7.9 (L) 8.9 - 10.3 mg/dL   GFR calc non Af Amer 42 (L) >60 mL/min   GFR calc Af Amer 49 (L) >60 mL/min    Comment: (NOTE) The eGFR has been calculated using the CKD EPI equation. This  calculation has not been validated in all clinical situations. eGFR's persistently <60 mL/min signify possible Chronic Kidney Disease.    Anion gap 7 5 - 15  Culture, blood (x 2)     Status: None (Preliminary result)   Collection Time: 04/13/15  2:14 PM  Result Value Ref Range   Specimen Description BLOOD BLOOD LEFT FOREARM    Special Requests BOTTLES DRAWN  AEROBIC AND ANAEROBIC 5CC EACH    Culture      NO GROWTH < 24 HOURS Performed at [facility]    Report Status PENDING   Culture, blood (x 2)     Status: None (Preliminary result)   Collection Time: 04/13/15  2:34 PM  Result Value Ref Range   Specimen Description BLOOD RIGHT ANTECUBITAL    Special Requests BOTTLES DRAWN AEROBIC AND ANAEROBIC 5CC EACH    Culture      NO GROWTH < 24 HOURS Performed at [facility]    Report Status PENDING   Procalcitonin     Status: None   Collection Time: 04/13/15  2:41 PM  Result Value Ref Range   Procalcitonin 0.28 ng/mL    Comment:        Interpretation: PCT (Procalcitonin) <= 0.5 ng/mL: Systemic infection (sepsis) is not likely. Local bacterial infection is possible. (NOTE)         ICU PCT Algorithm               Non ICU PCT Algorithm    ----------------------------     ------------------------------         PCT < 0.25 ng/mL                 PCT < 0.1 ng/mL     Stopping of antibiotics            Stopping of antibiotics       strongly encouraged.               strongly encouraged.    ----------------------------     ------------------------------       PCT level decrease by               PCT < 0.25 ng/mL       >= 80% from peak PCT       OR PCT 0.25 - 0.5 ng/mL          Stopping of antibiotics                                             encouraged.     Stopping of antibiotics           encouraged.    ----------------------------     ------------------------------       PCT level decrease by              PCT >= 0.25 ng/mL       < 80% from peak PCT        AND PCT >= 0.5 ng/mL            Continuin g antibiotics                                              encouraged.  Continuing antibiotics            encouraged.    ----------------------------     ------------------------------     PCT level increase compared          PCT > 0.5 ng/mL         with peak PCT AND          PCT >= 0.5 ng/mL             Escalation of  antibiotics                                          strongly encouraged.      Escalation of antibiotics        strongly encouraged.   Protime-INR     Status: None   Collection Time: 04/13/15  2:41 PM  Result Value Ref Range   Prothrombin Time 13.6 11.6 - 15.2 seconds   INR 1.02 0.00 - 1.49  APTT     Status: None   Collection Time: 04/13/15  2:41 PM  Result Value Ref Range   aPTT 28 24 - 37 seconds  Hemoglobin A1c     Status: Abnormal   Collection Time: 04/13/15  2:41 PM  Result Value Ref Range   Hgb A1c MFr Bld 8.5 (H) 4.8 - 5.6 %    Comment: (NOTE)         Pre-diabetes: 5.7 - 6.4         Diabetes: >6.4         Glycemic control for adults with diabetes: <7.0    Mean Plasma Glucose 197 mg/dL    Comment: (NOTE) Performed At: [facility] [place], [place] [id number] Lindon Romp MD [id number]   Lactic acid, plasma     Status: None   Collection Time: 04/13/15  2:42 PM  Result Value Ref Range   Lactic Acid, Venous 0.9 0.5 - 2.0 mmol/L  Lactic acid, plasma     Status: None   Collection Time: 04/13/15  4:18 PM  Result Value Ref Range   Lactic Acid, Venous 1.0 0.5 - 2.0 mmol/L  CBG monitoring, ED     Status: Abnormal   Collection Time: 04/13/15  5:10 PM  Result Value Ref Range   Glucose-Capillary 210 (H) 65 - 99 mg/dL  C difficile quick scan w PCR reflex     Status: None   Collection Time: 04/13/15  6:00 PM  Result Value Ref Range   C Diff antigen NEGATIVE NEGATIVE   C Diff toxin NEGATIVE NEGATIVE   C Diff interpretation Negative for toxigenic C. difficile   Glucose, capillary     Status: Abnormal   Collection Time: 04/13/15  8:30 PM  Result Value Ref Range   Glucose-Capillary 155 (H) 65 - 99 mg/dL  Comprehensive metabolic panel     Status: Abnormal   Collection Time: 04/14/15  5:28 AM  Result Value Ref Range   Sodium 140 135 - 145 mmol/L   Potassium 4.3 3.5 - 5.1 mmol/L    Comment: DELTA CHECK NOTED REPEATED TO VERIFY    Chloride 110  101 - 111 mmol/L   CO2 24 22 - 32 mmol/L   Glucose, Bld 90 65 - 99 mg/dL   BUN 36 (H) 6 - 20 mg/dL   Creatinine, Ser 2.10 (H) 0.61 - 1.24 mg/dL   Calcium 8.3 (L) 8.9 -  10.3 mg/dL   Total Protein 6.1 (L) 6.5 - 8.1 g/dL   Albumin 3.5 3.5 - 5.0 g/dL   AST 17 15 - 41 U/L   ALT 21 17 - 63 U/L   Alkaline Phosphatase 39 38 - 126 U/L   Total Bilirubin 0.6 0.3 - 1.2 mg/dL   GFR calc non Af Amer 40 (L) >60 mL/min   GFR calc Af Amer 46 (L) >60 mL/min    Comment: (NOTE) The eGFR has been calculated using the CKD EPI equation. This calculation has not been validated in all clinical situations. eGFR's persistently <60 mL/min signify possible Chronic Kidney Disease.    Anion gap 6 5 - 15  CBC     Status: Abnormal   Collection Time: 04/14/15  5:28 AM  Result Value Ref Range   WBC 6.0 4.0 - 10.5 K/uL   RBC 3.70 (L) 4.22 - 5.81 MIL/uL   Hemoglobin 10.7 (L) 13.0 - 17.0 g/dL   HCT 33.2 (L) 39.0 - 52.0 %   MCV 89.7 78.0 - 100.0 fL   MCH 28.9 26.0 - 34.0 pg   MCHC 32.2 30.0 - 36.0 g/dL   RDW 13.9 11.5 - 15.5 %   Platelets 273 150 - 400 K/uL  Glucose, capillary     Status: Abnormal   Collection Time: 04/14/15  8:00 AM  Result Value Ref Range   Glucose-Capillary 49 (L) 65 - 99 mg/dL   Comment 1 Notify RN    Comment 2 Document in Chart   Glucose, capillary     Status: None   Collection Time: 04/14/15  8:32 AM  Result Value Ref Range   Glucose-Capillary 85 65 - 99 mg/dL   Comment 1 Notify RN    Comment 2 Document in Chart   Glucose, capillary     Status: Abnormal   Collection Time: 04/14/15 12:01 PM  Result Value Ref Range   Glucose-Capillary 158 (H) 65 - 99 mg/dL   Comment 1 Notify RN    Comment 2 Document in Chart      Lipid Panel     Component Value Date/Time   CHOL 161 06/14/2014 1107   TRIG 48 06/14/2014 1107   HDL 60 06/14/2014 1107   CHOLHDL 2.7 06/14/2014 1107   VLDL 10 06/14/2014 1107   LDLCALC 91 06/14/2014 1107     Lab Results  Component Value Date   HGBA1C 8.5*  04/13/2015   HGBA1C 9.8* 03/17/2015   HGBA1C 8.2 09/15/2014     Lab Results  Component Value Date   MICROALBUR 47.2* 06/14/2014   LDLCALC 91 06/14/2014   CREATININE 2.10* 04/14/2015     HPI :*35 year old male with a history of diabetes mellitus type 1, on insulin, CK D stage III, hypertension, who presents to the ER because of nausea, nonbilious vomiting and diarrhea associated with fever that woke him up at 3:30 this morning. Patient presents with generalized myalgias, has had 4 episodes of diarrhea this morning, nonbloody,. Patient denies any cough, chest pain, shortness of breath. Patient found to have potassium of 6.0 in the ER. Also found to have borderline low blood pressure. Lactic acid 0.9. Patient given lomotil in the ER for diarrhea. Patient being admitted for probable sepsis, secondary to colitis/gastroenteritis.  HOSPITAL COURSE:   Acute viral gastroenteritis Patient started on Zosyn empirically, diarrhea self-limited and resolved , recent surgery for appendicitis from which he is healing well GI pathogen panel pending, blood culture  no growth so far, C. difficile negative Normal liver  function, lipase, Negative UA, normal lactate DC antibiotics, suspect viral gastroenteritis Patient tolerating diet and denies any nausea vomiting and would like to go home Anticipate discharge today if no recurrent symptoms   Hyperkalemia-likely the setting of ACE inhibitor use, discontinued ACE inhibitor , patient did receive Kayexalate 2 ACE inhibitor will be discontinued for now    Essential hypertension, benign-initially held antihypertensive medications in the setting of dehydration, patient to resume Norvasc in one week after PCP follow-up   CKD (chronic kidney disease) stage 3, GFR 30-59 ml/min-baseline creatinine around 1.8, creatinine is at baseline History of diabetic nephropathy, continue to follow renal function    Hyperlipidemia-continue with statin   Type 1  diabetes mellitus with nephropathy (HCC)-continue SSI, continue Lantus, dose reduced to 15 units at bedtime,Follows with Dr. Loanne Drilling    Discharge Exam: Blood pressure 105/65, pulse 89, temperature 98.5 F (36.9 C), temperature source Oral, resp. rate 16, SpO2 97 %.  Cardiovascular: RRR, S1 normal, S2 normal, no MRG, pulses symmetric and intact bilaterally  Pulmonary/Chest: CTAB, no wheezes, rales, or rhonchi  Abdominal: Soft. Non-tender, non-distended, bowel sounds are normal, no masses, organomegaly, or guarding present.  GU: no CVA tenderness Musculoskeletal: No joint deformities, erythema, or stiffness, ROM full and no nontender Ext: no edema and no cyanosis, pulses palpable bilaterally (DP and PT)  Hematology: no cervical, inginal, or axillary adenopathy.  Neurological: A&O x3, Strenght is normal and symmetric bilaterally, cranial nerve II-XII are grossly intact, no focal motor deficit, sensory intact to light touch bilaterally.       SignedReyne Dumas 04/14/2015, 12:31 PM        Time spent >45 mins

## 2015-04-14 NOTE — Progress Notes (Signed)
Patient given discharge instructions, and verbalized an understanding of all discharge instructions.  Patient agrees with discharge plan, and is being discharged in stable medical condition.  

## 2015-04-14 NOTE — Progress Notes (Signed)
Inpatient Diabetes Program Recommendations  AACE/ADA: New Consensus Statement on Inpatient Glycemic Control (2015)  Target Ranges:  Prepandial:   less than 140 mg/dL      Peak postprandial:   less than 180 mg/dL (1-2 hours)      Critically ill patients:  140 - 180 mg/dL   Review of Glycemic Control  Diabetes history: DM1 Outpatient Diabetes medications: Lantus 20 units QHS, Novolog 8 units tidwc Current orders for Inpatient glycemic control: Lantus 20 units QHS, Novolog sensitive tidwc Results for THOMAS, MABRY (MRN [id number]) as of 04/14/2015 11:39  Ref. Range 04/13/2015 08:12 04/13/2015 17:10 04/13/2015 20:30 04/14/2015 08:00 04/14/2015 08:32  Glucose-Capillary Latest Ref Range: 65-99 mg/dL 409 (H) 811 (H) 914 (H) 49 (L) 85  Results for ARREN, LAMINACK (MRN [id number]) as of 04/14/2015 11:39  Ref. Range 03/17/2015 01:06 04/13/2015 14:41  Hemoglobin A1C Latest Ref Range: 4.8-5.6 % 9.8 (H) 8.5 (H)   Hypoglycemia in am. Reduce basal insulin. Needs meal coverage insulin.   Inpatient Diabetes Program Recommendations:    Reduce Lantus to 15 units QHS Add Novolog 4 units tidwc for meal coverage insulin since pt is Type 1. F/U with endo after hospital discharge.   Will continue to follow while inpatient. Thank you. Ailene Ards, RD, LDN, CDE Inpatient Diabetes Coordinator [phone]

## 2015-04-18 LAB — CULTURE, BLOOD (ROUTINE X 2)
CULTURE: NO GROWTH
Culture: NO GROWTH

## 2015-06-03 ENCOUNTER — Ambulatory Visit: Payer: Managed Care, Other (non HMO) | Admitting: Endocrinology

## 2015-06-03 DIAGNOSIS — Z0289 Encounter for other administrative examinations: Secondary | ICD-10-CM

## 2015-07-13 ENCOUNTER — Other Ambulatory Visit: Payer: Self-pay | Admitting: Family Medicine

## 2015-07-18 ENCOUNTER — Ambulatory Visit (INDEPENDENT_AMBULATORY_CARE_PROVIDER_SITE_OTHER): Payer: Managed Care, Other (non HMO) | Admitting: Endocrinology

## 2015-07-18 ENCOUNTER — Encounter: Payer: Self-pay | Admitting: Endocrinology

## 2015-07-18 VITALS — BP 120/80 | HR 88 | Temp 99.0°F | Wt 162.0 lb

## 2015-07-18 DIAGNOSIS — E1021 Type 1 diabetes mellitus with diabetic nephropathy: Secondary | ICD-10-CM

## 2015-07-18 LAB — POCT GLYCOSYLATED HEMOGLOBIN (HGB A1C): Hemoglobin A1C: 7.6

## 2015-07-18 MED ORDER — INSULIN ASPART 100 UNIT/ML FLEXPEN: PEN_INJECTOR | SUBCUTANEOUS | Status: DC

## 2015-07-18 NOTE — Patient Instructions (Addendum)
check your blood sugar 4 times a day.  vary the time of day when you check, between before the 3 meals, and at bedtime.  also check if you have symptoms of your blood sugar being too high or too low.  please keep a record of the readings and bring it to your next appointment here.  You can write it on any piece of paper.  please call us sooner if your blood sugar goes below 70, or if you have a lot of readings over 200.  Please increase the novolog to 3 times a day (just before each meal) 8-9-8 units.   you should eat meals on a regular schedule.  If a meal is missed or significantly delayed, your blood sugar could go low. Please come back for a follow-up appointment in 3 months.

## 2015-07-18 NOTE — Progress Notes (Signed)
Subjective:    Patient ID: Jeffrey Hale, male    DOB: 10-Dec-1980, 35 y.o.   MRN: [id number]  HPI Pt returns for f/u of diabetes mellitus: DM type: 1 Dx'ed: 1990 Complications: renal failure and retinopathy.   Therapy: insulin since dx DKA: never Severe hypoglycemia: once (2013) Pancreatitis: never.  Other: he takes multiple daily injections.   Interval history: pt says he takes insulin as rx'ed.  He is still pursuing an insulin pump. He seldom has hypoglycemia, and these episodes are mild.  no cbg record, but states cbg's are highest in the afternoon, and at hs.  It is lowest fasting.  Past Medical History  Diagnosis Date  . Diabetes mellitus type I [facility])     onset age 56  . Hyperlipidemia   . CKD (chronic kidney disease) stage 3, GFR 30-59 ml/min 08/16/2014  . 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 Dione Booze, MD 07/08/2014     . Essential hypertension, benign 08/16/2014    Past Surgical History  Procedure Laterality Date  . Lung surgery  1996    Right Lung   . Laparoscopic appendectomy N/A 03/17/2015    Procedure: APPENDECTOMY LAPAROSCOPIC;  Surgeon: Claud Kelp, MD;  Location: [facility];  Service: General;  Laterality: N/A;    Social History   Social History  . Marital Status: Single    Spouse Name: N/A  . Number of Children: N/A  . Years of Education: N/A   Occupational History  . Not on file.   Social History Main Topics  . Smoking status: Never Smoker   . Smokeless tobacco: Not on file  . Alcohol Use: 0.0 oz/week    0 Standard drinks or equivalent per week     Comment: Beer - ocassionally   . Drug Use: No  . Sexual Activity: Yes   Other Topics Concern  . Not on file   Social History Narrative   Marital status: single; dating seriously x 5 years; [place]; moved to [place] in 08/2013.      Children: 2 children (22 yo daughter, 1 yo son)   Lives: alone; children with mothers in [place]      Employment:  Works for [employer]; Furniture conservator/restorer.      Tobacco: none      Alcohol: 4 drinks per week; no DUIs.      Drugs: none      Exercise:  Sporadic    Current Outpatient Prescriptions on File Prior to Visit  Medication Sig Dispense Refill  . glucagon (GLUCAGON EMERGENCY) 1 MG injection Inject 1 mg into the muscle once as needed. 1 each 12  . insulin glargine (LANTUS) 100 UNIT/ML injection Inject 0.15 mLs (15 Units total) into the skin at bedtime. 10 mL 11  . Insulin Pen Needle 33G X 6 MM MISC 1 Device by Does not apply route 4 (four) times daily. 120 each 11  . simvastatin (ZOCOR) 5 MG tablet TAKE 1 TABLET(5 MG) BY MOUTH DAILY 90 tablet 0  . amLODipine (NORVASC) 10 MG tablet Take 1 tablet (10 mg total) by mouth daily. (Patient not taking: Reported on 07/18/2015) 30 tablet 0   No current facility-administered medications on file prior to visit.    No Known Allergies  Family History  Problem Relation Age of Onset  . Diabetes Mother     DMII  . Hypertension Mother   . Diabetes Father  DMII  . Hypertension Father   . Hyperlipidemia Father   . Depression Father     BP 120/80 mmHg  Pulse 88  Temp(Src) 99 F (37.2 C) (Oral)  Wt 162 lb (73.483 kg)  SpO2 97%  Review of Systems He denies hypoglycemia    Objective:   Physical Exam VITAL SIGNS:  See vs page GENERAL: no distress Pulses: dorsalis pedis intact bilat  MSK: no deformity of the feet CV: no leg edema.  Skin:  no ulcer on the feet.  normal color and temp on the feet.  Neuro: sensation is intact to touch on the feet.   Lab Results  Component Value Date   HGBA1C 7.6 07/18/2015      Assessment & Plan:  DM: he needs increased rx.   Patient is advised the following: Patient Instructions  check your blood sugar 4 times a day.  vary the time of day when you check, between before the 3 meals, and at bedtime.  also check if you have symptoms of your  blood sugar being too high or too low.  please keep a record of the readings and bring it to your next appointment here.  You can write it on any piece of paper.  please call us sooner if your blood sugar goes below 70, or if you have a lot of readings over 200.  Please increase the novolog to 3 times a day (just before each meal) 8-9-8 units.   you should eat meals on a regular schedule.  If a meal is missed or significantly delayed, your blood sugar could go low. Please come back for a follow-up appointment in 3 months.

## 2015-07-19 ENCOUNTER — Other Ambulatory Visit: Payer: Self-pay

## 2015-07-19 MED ORDER — INSULIN LISPRO 100 UNIT/ML (KWIKPEN): PEN_INJECTOR | SUBCUTANEOUS | Status: DC

## 2015-09-26 ENCOUNTER — Encounter: Payer: Self-pay | Admitting: Family Medicine

## 2015-09-26 LAB — HM DIABETES EYE EXAM

## 2015-10-18 ENCOUNTER — Ambulatory Visit: Payer: Managed Care, Other (non HMO) | Admitting: Endocrinology

## 2015-10-18 DIAGNOSIS — Z0289 Encounter for other administrative examinations: Secondary | ICD-10-CM

## 2015-10-19 ENCOUNTER — Telehealth: Payer: Self-pay | Admitting: Endocrinology

## 2015-10-19 NOTE — Telephone Encounter (Signed)
Patient no showed today's appt. Please advise on how to follow up. °A. No follow up necessary. °B. Follow up urgent. Contact patient immediately. °C. Follow up necessary. Contact patient and schedule visit in ___ days. °D. Follow up advised. Contact patient and schedule visit in ____weeks. ° °

## 2015-10-19 NOTE — Telephone Encounter (Signed)
Please come back for a follow-up appointment in 1 month.  

## 2015-10-20 NOTE — Telephone Encounter (Signed)
Jeffrey Hale, Can you please schedule this appointment?

## 2015-10-21 NOTE — Telephone Encounter (Signed)
Called PT, no VM set up

## 2015-11-26 ENCOUNTER — Other Ambulatory Visit: Payer: Self-pay | Admitting: Endocrinology

## 2015-12-31 ENCOUNTER — Other Ambulatory Visit: Payer: Self-pay | Admitting: Endocrinology

## 2016-01-26 ENCOUNTER — Ambulatory Visit (INDEPENDENT_AMBULATORY_CARE_PROVIDER_SITE_OTHER): Payer: Managed Care, Other (non HMO) | Admitting: Endocrinology

## 2016-01-26 ENCOUNTER — Encounter: Payer: Self-pay | Admitting: Endocrinology

## 2016-01-26 VITALS — BP 132/84 | HR 88 | Ht 64.5 in | Wt 167.0 lb

## 2016-01-26 DIAGNOSIS — E1021 Type 1 diabetes mellitus with diabetic nephropathy: Secondary | ICD-10-CM | POA: Diagnosis not present

## 2016-01-26 LAB — POCT GLYCOSYLATED HEMOGLOBIN (HGB A1C): Hemoglobin A1C: 8.3

## 2016-01-26 MED ORDER — INSULIN LISPRO 100 UNIT/ML (KWIKPEN): PEN_INJECTOR | SUBCUTANEOUS | 2 refills | Status: DC

## 2016-01-26 NOTE — Progress Notes (Signed)
Subjective:    Patient ID: Jeffrey Hale, male    DOB: 1980-03-30, 35 y.o.   MRN: [id number]  HPI Pt returns for f/u of diabetes mellitus: DM type: 1 Dx'ed: 1990 Complications: renal failure and retinopathy.   Therapy: insulin since dx DKA: never Severe hypoglycemia: once (2013) Pancreatitis: never.  Other: he takes multiple daily injections.   Interval history: pt says he takes insulin as rx'ed.  He is still pursuing an insulin pump. pt states he feels well in general. no cbg record, but states cbg's are highest in the afternoon.  It is lowest fasting.  Past Medical History:  Diagnosis Date  . 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 Dione BoozeGroat, MD 07/08/2014     . CKD (chronic kidney disease) stage 3, GFR 30-59 ml/min 08/16/2014  . Diabetes mellitus type I [facility](HCC)    onset age 89  . Essential hypertension, benign 08/16/2014  . Hyperlipidemia     Past Surgical History:  Procedure Laterality Date  . LAPAROSCOPIC APPENDECTOMY N/A 03/17/2015   Procedure: APPENDECTOMY LAPAROSCOPIC;  Surgeon: Jeffrey KelpHaywood Ingram, MD;  Location: [facility];  Service: General;  Laterality: N/A;  . LUNG SURGERY  1996   Right Lung     Social History   Social History  . Marital status: Single    Spouse name: N/A  . Number of children: N/A  . Years of education: N/A   Occupational History  . Not on file.   Social History Main Topics  . Smoking status: Never Smoker  . Smokeless tobacco: Not on file  . Alcohol use 0.0 oz/week     Comment: Beer - ocassionally   . Drug use: No  . Sexual activity: Yes   Other Topics Concern  . Not on file   Social History Narrative   Marital status: single; dating seriously x 5 years; [place]; moved to [place] in 08/2013.      Children: 2 children 36(8 yo daughter, 1 yo son)      Lives: alone; children with mothers in [place].      Employment:   Works for [employer]; Furniture conservator/restorerproduction manager.      Tobacco: none      Alcohol: 4 drinks per week; no DUIs.      Drugs: none      Exercise:  Sporadic    Current Outpatient Prescriptions on File Prior to Visit  Medication Sig Dispense Refill  . amLODipine (NORVASC) 10 MG tablet Take 1 tablet (10 mg total) by mouth daily. 30 tablet 0  . cloNIDine (CATAPRES) 0.1 MG tablet Take 0.1 mg by mouth 2 (two) times daily.    Marland Kitchen. glucagon (GLUCAGON EMERGENCY) 1 MG injection Inject 1 mg into the muscle once as needed. 1 each 12  . insulin glargine (LANTUS) 100 UNIT/ML injection Inject 0.15 mLs (15 Units total) into the skin at bedtime. 10 mL 11  . Insulin Pen Needle 33G X 6 MM MISC 1 Device by Does not apply route 4 (four) times daily. 120 each 11  . simvastatin (ZOCOR) 5 MG tablet TAKE 1 TABLET(5 MG) BY MOUTH DAILY 90 tablet 0   No current facility-administered medications on file prior to visit.     No Known Allergies  Family History  Problem Relation Age of Onset  . Diabetes Mother     DMII  . Hypertension Mother   . Diabetes Father     DMII  .  Hypertension Father   . Hyperlipidemia Father   . Depression Father     BP 132/84   Pulse 88   Ht 5' 4.5" (1.638 m)   Wt 167 lb (75.8 kg)   SpO2 95%   BMI 28.22 kg/m    Review of Systems He denies hypoglycemia.      Objective:   Physical Exam VITAL SIGNS:  See vs page GENERAL: no distress Pulses: dorsalis pedis intact bilat  MSK: no deformity of the feet CV: no leg edema.  Skin:  no ulcer on the feet.  normal color and temp on the feet.  Neuro: sensation is intact to touch on the feet.    A1c=8.3%    Assessment & Plan:  Type 1 DM, with retinopathy: he needs increased rx  Patient is advised the following: Patient Instructions  check your blood sugar 4 times a day.  vary the time of day when you check, between before the 3 meals, and at bedtime.  also check if you have symptoms of your blood sugar being too high or too low.  please keep  a record of the readings and bring it to your next appointment here.  You can write it on any piece of paper.  please call us sooner if your blood sugar goes below 70, or if you have a lot of readings over 200.  Please continue the same lantus, and: Please increase the novolog to 3 times a day (just before each meal) 9-12-9 units.   Please come back for a follow-up appointment in 2 months.

## 2016-01-26 NOTE — Patient Instructions (Addendum)
check your blood sugar 4 times a day.  vary the time of day when you check, between before the 3 meals, and at bedtime.  also check if you have symptoms of your blood sugar being too high or too low.  please keep a record of the readings and bring it to your next appointment here.  You can write it on any piece of paper.  please call us sooner if your blood sugar goes below 70, or if you have a lot of readings over 200.  Please continue the same lantus, and: Please increase the novolog to 3 times a day (just before each meal) 9-12-9 units.   Please come back for a follow-up appointment in 2 months.

## 2016-01-31 ENCOUNTER — Other Ambulatory Visit: Payer: Self-pay | Admitting: Endocrinology

## 2016-02-18 ENCOUNTER — Other Ambulatory Visit: Payer: Self-pay | Admitting: Family Medicine

## 2016-03-08 ENCOUNTER — Other Ambulatory Visit: Payer: Self-pay

## 2016-03-08 ENCOUNTER — Other Ambulatory Visit: Payer: Self-pay | Admitting: Endocrinology

## 2016-03-08 MED ORDER — INSULIN ASPART 100 UNIT/ML FLEXPEN: PEN_INJECTOR | SUBCUTANEOUS | 11 refills | Status: DC

## 2016-03-09 ENCOUNTER — Telehealth: Payer: Self-pay | Admitting: Endocrinology

## 2016-03-09 NOTE — Telephone Encounter (Signed)
[facility] will no longer cover the Novalog but will cover the Humalog. please advise   [facility] [id number] [place] - [id number] AT [facility] [address] [facility] [address] [phone] (Phone) [phone] (Fax)

## 2016-03-12 MED ORDER — INSULIN LISPRO 100 UNIT/ML (KWIKPEN): 7.0000 [IU] | PEN_INJECTOR | Freq: Three times a day (TID) | SUBCUTANEOUS | 5 refills | Status: DC

## 2016-03-12 NOTE — Telephone Encounter (Signed)
See message, ok to change?

## 2016-03-12 NOTE — Telephone Encounter (Signed)
Prescription submitted for humalog.  

## 2016-03-12 NOTE — Telephone Encounter (Signed)
OK 

## 2016-03-13 ENCOUNTER — Other Ambulatory Visit: Payer: Self-pay

## 2016-03-13 MED ORDER — INSULIN ASPART 100 UNIT/ML FLEXPEN: PEN_INJECTOR | SUBCUTANEOUS | 11 refills | Status: DC

## 2016-03-13 NOTE — Telephone Encounter (Signed)
Received a fax from [facility] on 03/13/2016 stating the Humalog was not covered but Novolog was. Rx resubmitted for Novolog.

## 2016-03-14 IMAGING — CT CT ABD-PELV W/O CM
2 of 4 series · 16 of 46 positions shown, 18 images · non-contrast
Comparison: None.

CLINICAL DATA: Central abdominal pain with nausea and vomiting,
fever. History of hypertension, hyperlipidemia, diabetes and RIGHT
lung surgery.

EXAM:
CT ABDOMEN AND PELVIS WITHOUT CONTRAST
TECHNIQUE: Multidetector CT imaging of the abdomen and pelvis was performed
following the standard protocol without IV contrast.

[Series 2: abd/pel w/o · axial · non-contrast · 0.69mm/px · z∈[+1018,+1398]mm · 13 of 84 slices shown, 15 images]
[im 4/84  soft-tissue]
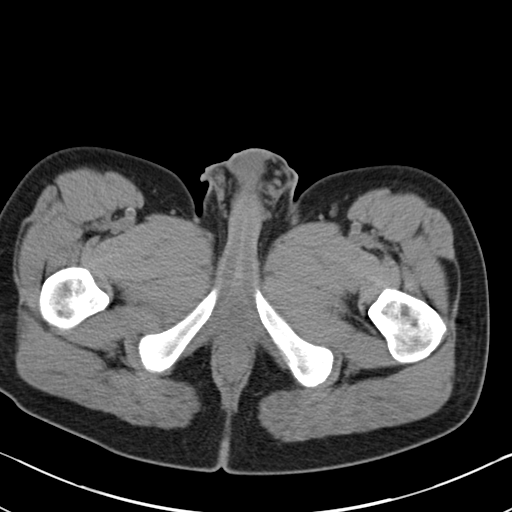
[im 4/84  bone]
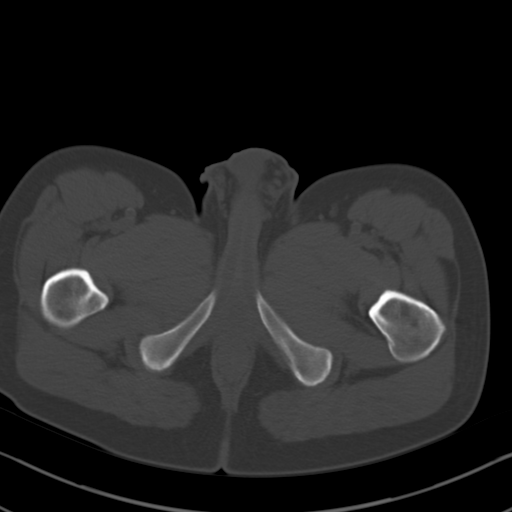
[im 10/84  soft-tissue]
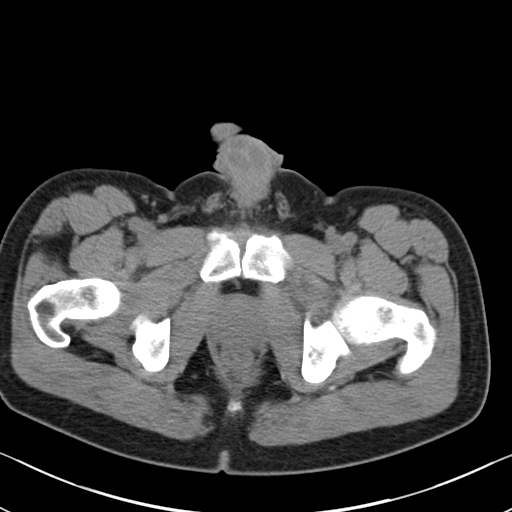
[im 16/84  soft-tissue]
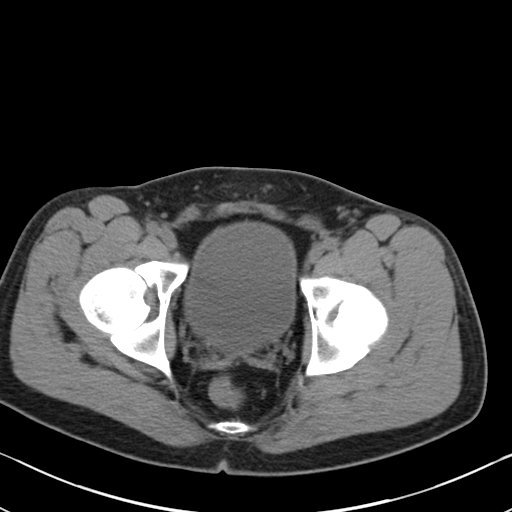
[im 23/84  soft-tissue]
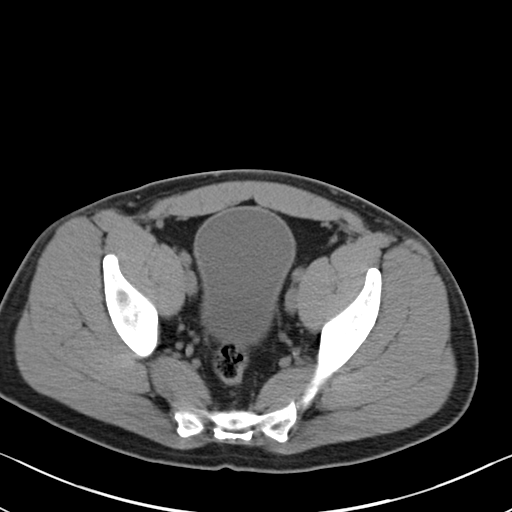
[im 29/84  soft-tissue]
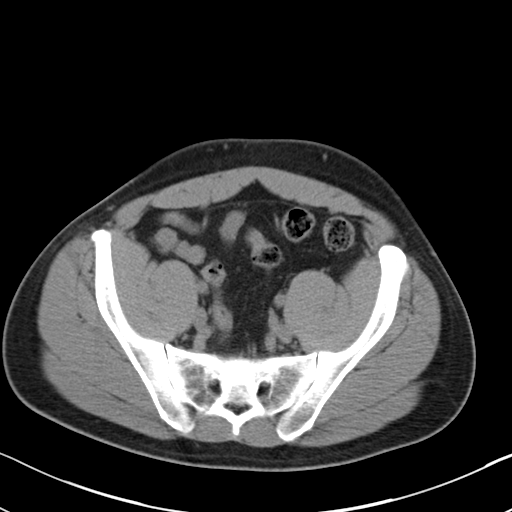
[im 36/84  soft-tissue]
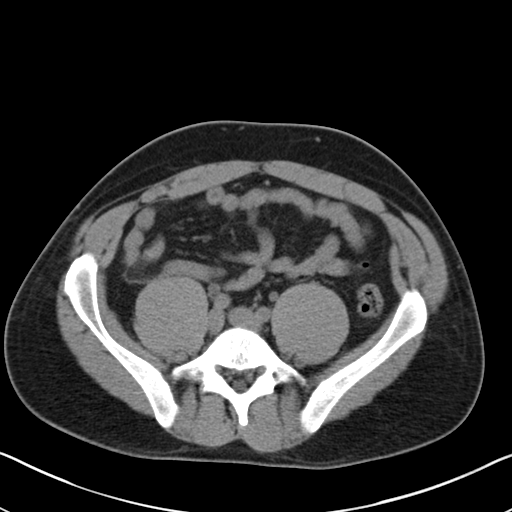
[im 42/84  soft-tissue]
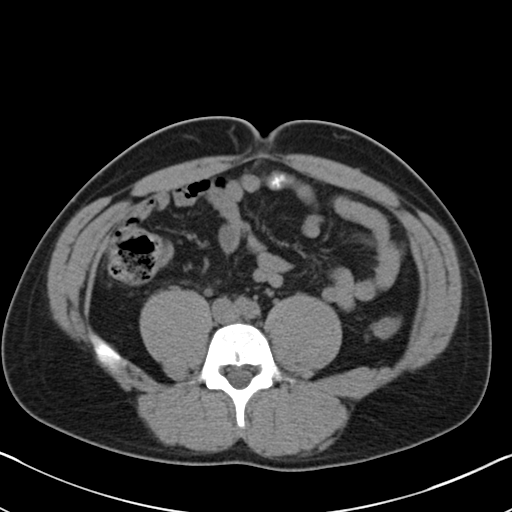
[im 48/84  soft-tissue]
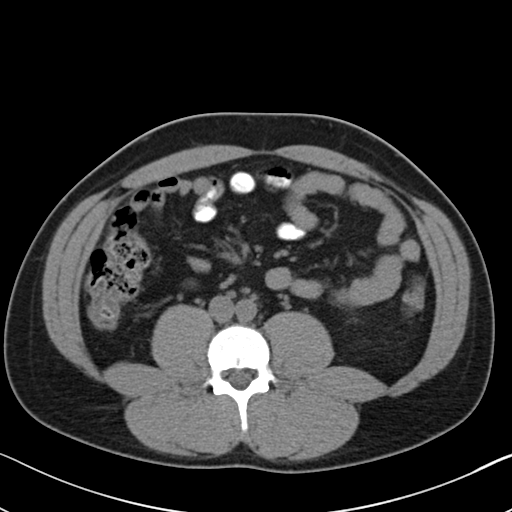
[im 55/84  soft-tissue]
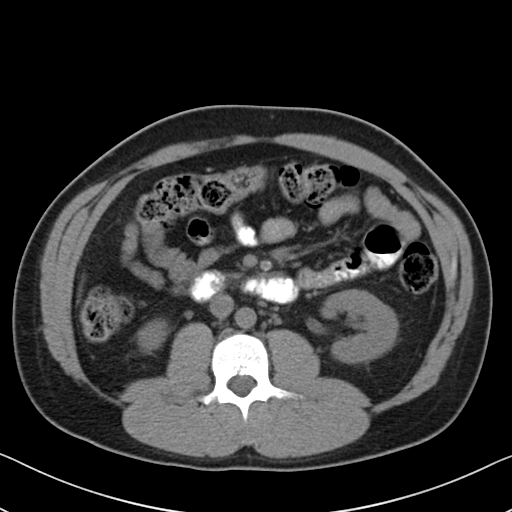
[im 55/84  bone]
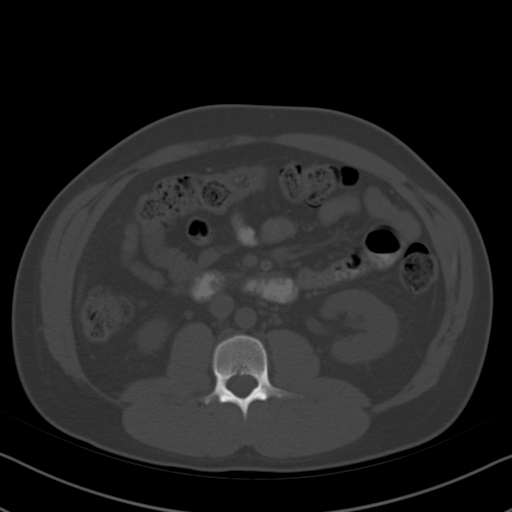
[im 61/84  soft-tissue]
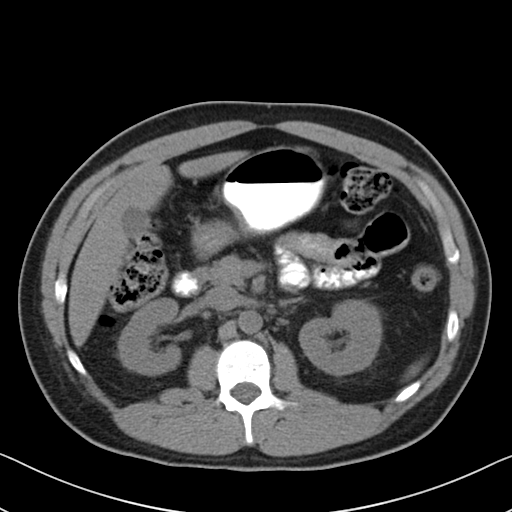
[im 68/84  soft-tissue]
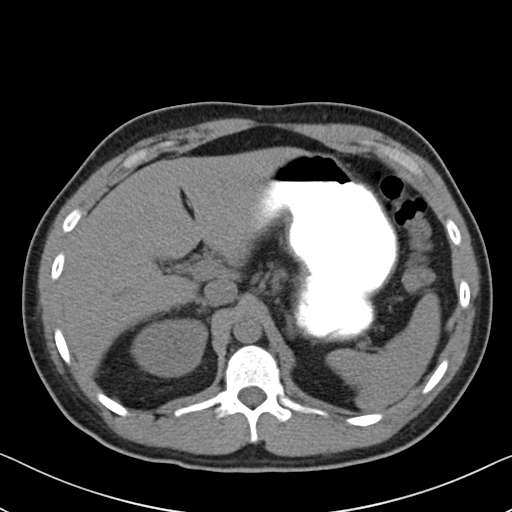
[im 74/84  soft-tissue]
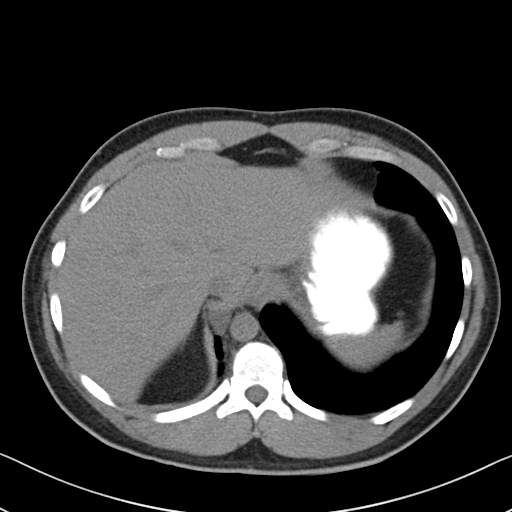
[im 80/84  soft-tissue]
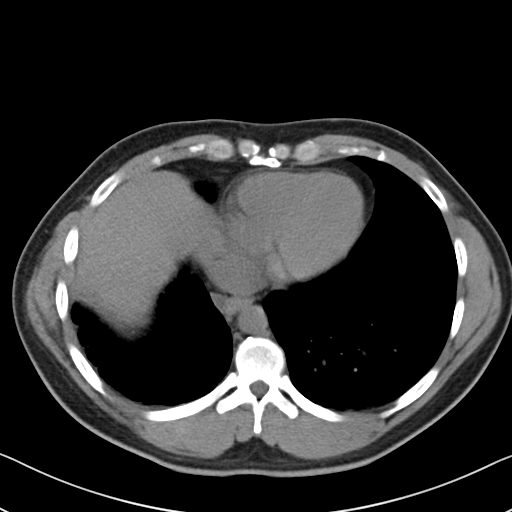

[Series 4: coronal · coronal · 0.65mm/px · 3 of 81 slices shown]
[im 27/81  soft-tissue]
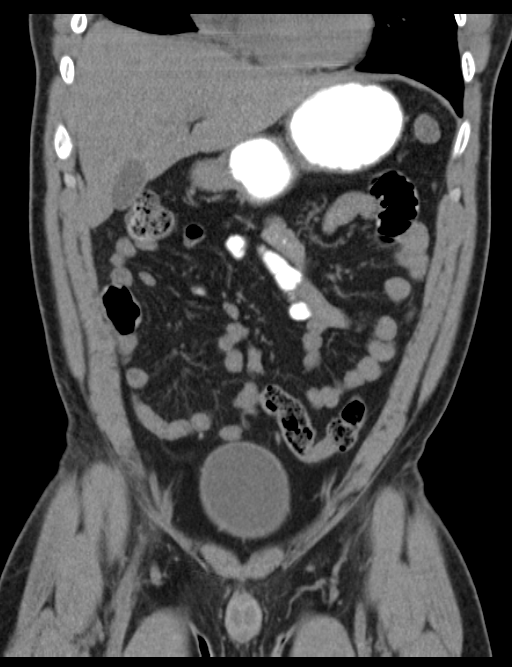
[im 36/81  soft-tissue]
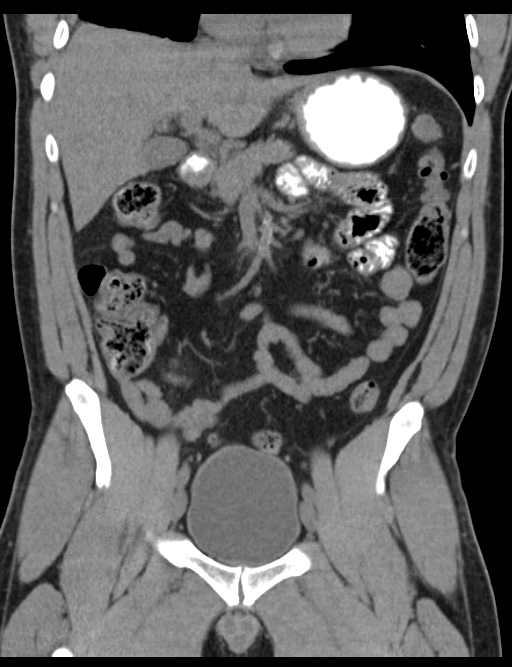
[im 45/81  soft-tissue]
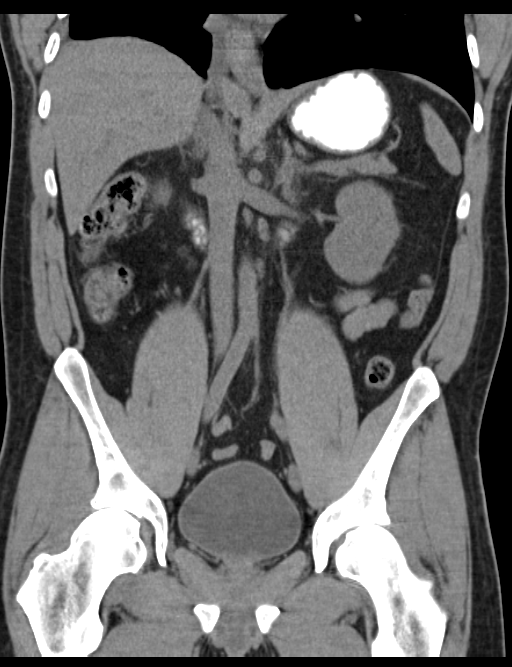

[16 of 46 positions shown; findings below may reference images not displayed]

FINDINGS: LUNG BASES: Mild scarring in RIGHT lung base, with dependent
atelectasis ; focal thickening of the medial RIGHT pleural
reflection is likely postoperative. The visualized heart and
pericardium are unremarkable.

KIDNEYS/BLADDER: Kidneys are orthotopic, demonstrating normal size
and morphology. No nephrolithiasis, hydronephrosis; limited
assessment for renal masses on this nonenhanced examination. The
unopacified ureters are normal in course and caliber. Urinary
bladder is partially distended and unremarkable.

SOLID ORGANS: Punctate layering gallstones. No CT findings of acute
cholecystitis. The liver, spleen, pancreas and adrenal glands are
unremarkable for this non-contrast examination.

GASTROINTESTINAL TRACT: The stomach, small and large bowel are
normal in course and caliber without inflammatory changes, the
sensitivity may be decreased by lack of enteric contrast. Enteric
contrast has not yet reached the distal small bowel. The appendix is
enlarged, 13 mm in transaxial dimension with periappendiceal
inflammation, no focal fluid collection or appendicolith.

PERITONEUM/RETROPERITONEUM: Aortoiliac vessels are normal in course
and caliber. No lymphadenopathy by CT size criteria. Prostate size
is normal. No intraperitoneal free fluid nor free air.

SOFT TISSUES/ OSSEOUS STRUCTURES:  Nonsuspicious.
IMPRESSION: Acute appendicitis without complication.

Cholelithiasis without CT findings of acute cholecystitis.

Acute findings discussed with and reconfirmed by Dr.Klever on
03/17/2015 at [DATE].

## 2016-03-27 ENCOUNTER — Ambulatory Visit: Payer: Managed Care, Other (non HMO) | Admitting: Endocrinology

## 2016-04-08 ENCOUNTER — Other Ambulatory Visit: Payer: Self-pay | Admitting: Family Medicine

## 2016-04-10 ENCOUNTER — Ambulatory Visit: Payer: Managed Care, Other (non HMO) | Admitting: Endocrinology

## 2016-04-10 IMAGING — DX DG CHEST 1V PORT
1 series · 1 of 1 positions shown · non-contrast
Comparison: 03/17/2015

CLINICAL DATA: Nausea, vomiting, diarrhea and fever beginning
today.

EXAM:
PORTABLE CHEST 1 VIEW

[chest ap]
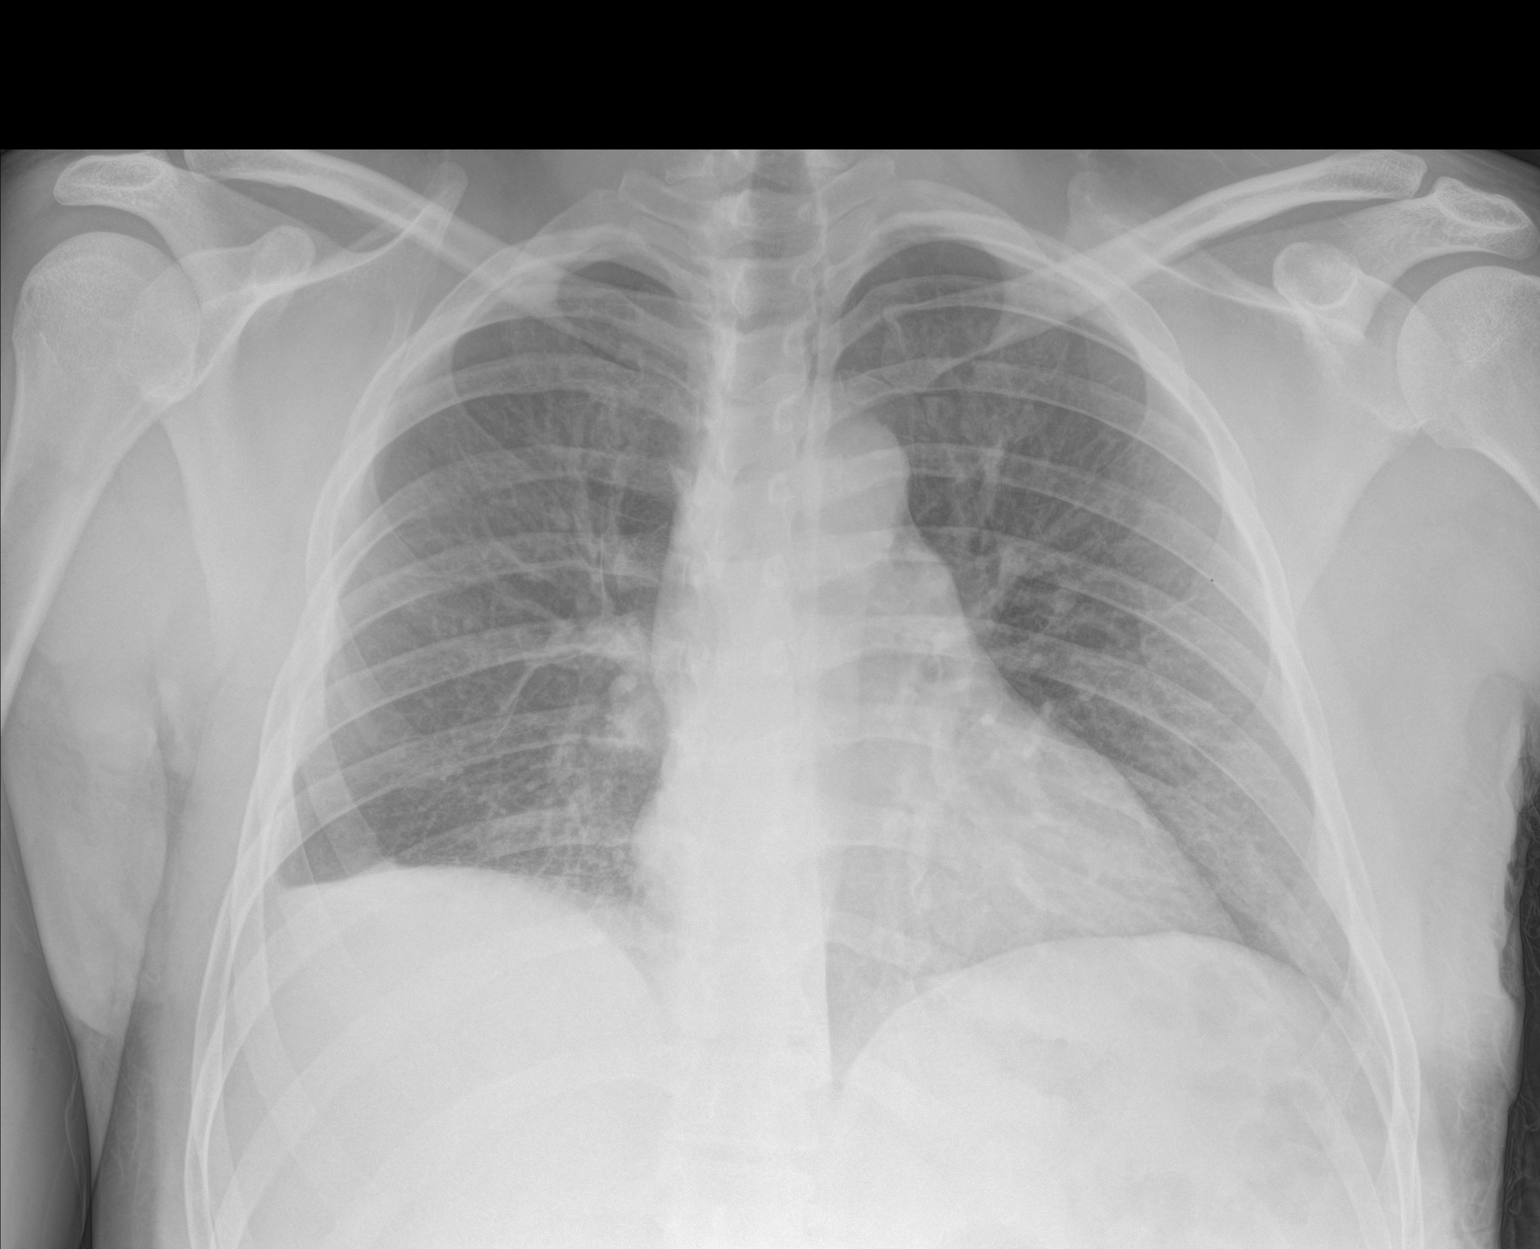

[1 of 1 positions shown; findings below may reference images not displayed]

FINDINGS: Heart size is normal. Mediastinal shadows are normal. There is lower
lobe infiltrate consistent with pneumonia, definitely present at the
medial right base and possibly in the left lower lobe is well. No
dense consolidation or lobar collapse.
IMPRESSION: Lower lobe pneumonia, definite on the right and possible on the
left.

## 2016-04-12 ENCOUNTER — Ambulatory Visit: Payer: Managed Care, Other (non HMO) | Admitting: Endocrinology

## 2016-05-08 ENCOUNTER — Ambulatory Visit: Payer: Managed Care, Other (non HMO) | Admitting: Endocrinology

## 2016-05-09 ENCOUNTER — Telehealth: Payer: Self-pay | Admitting: Endocrinology

## 2016-05-09 NOTE — Telephone Encounter (Signed)
Patient no showed today's appt. Please advise on how to follow up. °A. No follow up necessary. °B. Follow up urgent. Contact patient immediately. °C. Follow up necessary. Contact patient and schedule visit in ___ days. °D. Follow up advised. Contact patient and schedule visit in ____weeks. ° °

## 2016-05-09 NOTE — Telephone Encounter (Signed)
Please come back for a follow-up appointment in 6 weeks  

## 2016-05-14 NOTE — Telephone Encounter (Signed)
Called patient, his VM is not set up.

## 2016-06-05 ENCOUNTER — Telehealth: Payer: Self-pay | Admitting: Endocrinology

## 2016-06-05 MED ORDER — INSULIN GLARGINE 100 UNIT/ML ~~LOC~~ SOLN: 15.0000 [IU] | Freq: Every day | SUBCUTANEOUS | 11 refills | Status: DC

## 2016-06-05 NOTE — Telephone Encounter (Signed)
Pt needs refills called in for the lantus vial [facility]

## 2016-06-05 NOTE — Telephone Encounter (Signed)
Refill submitted. 

## 2016-06-07 ENCOUNTER — Other Ambulatory Visit: Payer: Self-pay

## 2016-06-07 MED ORDER — BASAGLAR KWIKPEN 100 UNIT/ML ~~LOC~~ SOPN: PEN_INJECTOR | SUBCUTANEOUS | 5 refills | Status: DC

## 2016-06-11 ENCOUNTER — Ambulatory Visit: Payer: Managed Care, Other (non HMO) | Admitting: Endocrinology

## 2016-06-15 ENCOUNTER — Ambulatory Visit: Payer: Managed Care, Other (non HMO) | Admitting: Endocrinology

## 2016-08-08 ENCOUNTER — Other Ambulatory Visit: Payer: Self-pay | Admitting: Family Medicine

## 2016-09-12 ENCOUNTER — Ambulatory Visit: Payer: Managed Care, Other (non HMO) | Admitting: Endocrinology

## 2016-09-12 DIAGNOSIS — Z0289 Encounter for other administrative examinations: Secondary | ICD-10-CM

## 2016-10-17 ENCOUNTER — Ambulatory Visit: Payer: Managed Care, Other (non HMO) | Admitting: Endocrinology

## 2016-10-19 ENCOUNTER — Ambulatory Visit (INDEPENDENT_AMBULATORY_CARE_PROVIDER_SITE_OTHER): Payer: 59 | Admitting: Endocrinology

## 2016-10-19 VITALS — BP 132/70 | HR 86 | Wt 166.4 lb

## 2016-10-19 DIAGNOSIS — E1021 Type 1 diabetes mellitus with diabetic nephropathy: Secondary | ICD-10-CM

## 2016-10-19 LAB — POCT GLYCOSYLATED HEMOGLOBIN (HGB A1C): Hemoglobin A1C: 7.4

## 2016-10-19 MED ORDER — INSULIN ASPART 100 UNIT/ML FLEXPEN: 7.0000 [IU] | PEN_INJECTOR | Freq: Three times a day (TID) | SUBCUTANEOUS | 11 refills | Status: AC

## 2016-10-19 NOTE — Progress Notes (Signed)
Subjective:    Patient ID: Jeffrey Hale, male    DOB: Sep 27, 1980, 36 y.o.   MRN: [id number]  HPI Pt returns for f/u of diabetes mellitus: DM type: 1 Dx'ed: 1990 Complications: renal failure and retinopathy.   Therapy: insulin since dx DKA: never Severe hypoglycemia: once (2013) Pancreatitis: never.  Other: he takes multiple daily injections; he works 1st shift, as a Furniture conservator/restorer; he declines pump and continuous glucose monitor Interval history: pt says he takes insulin as rx'ed.  no cbg record, but states cbg's vary from 92-150.  It is highest in the afternoon.   Past Medical History:  Diagnosis Date  . 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 Dione Booze, MD 07/08/2014     . CKD (chronic kidney disease) stage 3, GFR 30-59 ml/min 08/16/2014  . Diabetes mellitus type I [facility])    onset age 89  . Essential hypertension, benign 08/16/2014  . Hyperlipidemia     Past Surgical History:  Procedure Laterality Date  . LAPAROSCOPIC APPENDECTOMY N/A 03/17/2015   Procedure: APPENDECTOMY LAPAROSCOPIC;  Surgeon: Claud Kelp, MD;  Location: [facility];  Service: General;  Laterality: N/A;  . LUNG SURGERY  1996   Right Lung     Social History   Social History  . Marital status: Single    Spouse name: N/A  . Number of children: N/A  . Years of education: N/A   Occupational History  . Not on file.   Social History Main Topics  . Smoking status: Never Smoker  . Smokeless tobacco: Not on file  . Alcohol use 0.0 oz/week     Comment: Beer - ocassionally   . Drug use: No  . Sexual activity: Yes   Other Topics Concern  . Not on file   Social History Narrative   Marital status: single; dating seriously x 5 years; [place]; moved to [place] in 08/2013.      Children: 2 children (54 yo daughter, 1 yo son)      Lives: alone; children with mothers [place]   Employment:  Works for [employer]; Furniture conservator/restorer.      Tobacco: none      Alcohol: 4 drinks per week; no DUIs.      Drugs: none      Exercise:  Sporadic    Current Outpatient Prescriptions on File Prior to Visit  Medication Sig Dispense Refill  . amLODipine (NORVASC) 10 MG tablet Take 1 tablet (10 mg total) by mouth daily. 30 tablet 0  . cloNIDine (CATAPRES) 0.1 MG tablet Take 0.1 mg by mouth 2 (two) times daily.    Marland Kitchen glucagon (GLUCAGON EMERGENCY) 1 MG injection Inject 1 mg into the muscle once as needed. 1 each 12  . Insulin Glargine (BASAGLAR KWIKPEN) 100 UNIT/ML SOPN Inject 0.15 mLs (15 Units total) into the skin at bedtime. 15 mL 5  . Insulin Pen Needle 33G X 6 MM MISC 1 Device by Does not apply route 4 (four) times daily. 120 each 11  . lisinopril (PRINIVIL,ZESTRIL) 10 MG tablet     . lisinopril (PRINIVIL,ZESTRIL) 10 MG tablet TAKE 1 TABLET(10 MG) BY MOUTH DAILY 30 tablet 0  . simvastatin (ZOCOR) 5 MG tablet TAKE 1 TABLET(5 MG) BY MOUTH DAILY 90 tablet 0   No current facility-administered medications on file prior to visit.     No Known Allergies  Family History  Problem Relation Age of Onset  .  Diabetes Mother        DMII  . Hypertension Mother   . Diabetes Father        DMII  . Hypertension Father   . Hyperlipidemia Father   . Depression Father     BP 132/70   Pulse 86   Wt 166 lb 6.4 oz (75.5 kg)   SpO2 98%   BMI 28.12 kg/m   Review of Systems He denies hypoglycemia    Objective:   Physical Exam VITAL SIGNS:  See vs page GENERAL: no distress Pulses: foot pulses are intact bilaterally.   MSK: no deformity of the feet or ankles.  CV: no edema of the legs or ankles Skin:  no ulcer on the feet or ankles.  normal color and temp on the feet and ankles Neuro: sensation is intact to touch on the feet and ankles.    Lab Results  Component Value Date   HGBA1C 7.4 10/19/2016      Assessment & Plan:  Insulin-requiring type 2 DM, with DR: she needs increased  rx.   Patient Instructions  check your blood sugar 4 times a day.  vary the time of day when you check, between before the 3 meals, and at bedtime.  also check if you have symptoms of your blood sugar being too high or too low.  please keep a record of the readings and bring it to your next appointment here.  You can write it on any piece of paper.  please call us sooner if your blood sugar goes below 70, or if you have a lot of readings over 200.  Please continue the same basaglar, and: Please increase the novolog to 3 times a day (just before each meal) 7-8-7 units.   Please come back for a follow-up appointment in 3 months.

## 2016-10-19 NOTE — Patient Instructions (Addendum)
check your blood sugar 4 times a day.  vary the time of day when you check, between before the 3 meals, and at bedtime.  also check if you have symptoms of your blood sugar being too high or too low.  please keep a record of the readings and bring it to your next appointment here.  You can write it on any piece of paper.  please call us sooner if your blood sugar goes below 70, or if you have a lot of readings over 200.  Please continue the same basaglar, and: Please increase the novolog to 3 times a day (just before each meal) 7-8-7 units.   Please come back for a follow-up appointment in 3 months.

## 2017-01-21 ENCOUNTER — Ambulatory Visit: Payer: 59 | Admitting: Endocrinology

## 2017-02-04 ENCOUNTER — Ambulatory Visit: Payer: 59 | Admitting: Endocrinology

## 2017-06-12 ENCOUNTER — Other Ambulatory Visit: Payer: Self-pay | Admitting: Endocrinology

## 2017-07-12 ENCOUNTER — Encounter: Payer: Self-pay | Admitting: Family Medicine

## 2017-07-17 ENCOUNTER — Encounter: Payer: Self-pay | Admitting: Family Medicine
